# Patient Record
Sex: Male | Born: 1965 | Race: Black or African American | Hispanic: No | Marital: Single | Smoking: Current every day smoker
Health system: Southern US, Community
[De-identification: ages and names within clinical notes are randomized; demographics above are authoritative.]

## PROBLEM LIST (undated history)

## (undated) DIAGNOSIS — N200 Calculus of kidney: Secondary | ICD-10-CM

---

## 2000-08-20 ENCOUNTER — Emergency Department (HOSPITAL_COMMUNITY): Admission: EM | Admit: 2000-08-20 | Discharge: 2000-08-20 | Payer: Self-pay | Admitting: Emergency Medicine

## 2000-08-21 ENCOUNTER — Emergency Department (HOSPITAL_COMMUNITY): Admission: EM | Admit: 2000-08-21 | Discharge: 2000-08-21 | Payer: Self-pay | Admitting: Emergency Medicine

## 2012-03-28 ENCOUNTER — Emergency Department: Payer: Self-pay | Admitting: Emergency Medicine

## 2012-03-28 LAB — CBC
HCT: 45.5 % (ref 40.0–52.0)
HGB: 15.5 g/dL (ref 13.0–18.0)
MCH: 31.3 pg (ref 26.0–34.0)
MCHC: 34 g/dL (ref 32.0–36.0)
MCV: 92 fL (ref 80–100)
Platelet: 235 10*3/uL (ref 150–440)
RBC: 4.95 10*6/uL (ref 4.40–5.90)
RDW: 14.5 % (ref 11.5–14.5)
WBC: 6.7 10*3/uL (ref 3.8–10.6)

## 2012-03-28 LAB — BASIC METABOLIC PANEL
Anion Gap: 6 — ABNORMAL LOW (ref 7–16)
BUN: 10 mg/dL (ref 7–18)
Calcium, Total: 8.7 mg/dL (ref 8.5–10.1)
Chloride: 107 mmol/L (ref 98–107)
Co2: 25 mmol/L (ref 21–32)
Creatinine: 1.02 mg/dL (ref 0.60–1.30)
EGFR (African American): >60
EGFR (Non-African Amer.): >60
Glucose: 81 mg/dL (ref 65–99)
Osmolality: 274 (ref 275–301)
Potassium: 3.9 mmol/L (ref 3.5–5.1)
Sodium: 138 mmol/L (ref 136–145)

## 2014-01-25 ENCOUNTER — Inpatient Hospital Stay (HOSPITAL_COMMUNITY)
Admission: EM | Admit: 2014-01-25 | Discharge: 2014-01-26 | DRG: 379 | Discharge status: Against Medical Advice | Payer: Self-pay | Attending: Internal Medicine | Admitting: Internal Medicine

## 2014-01-25 ENCOUNTER — Encounter (HOSPITAL_COMMUNITY): Payer: Self-pay | Admitting: Emergency Medicine

## 2014-01-25 DIAGNOSIS — K2990 Gastroduodenitis, unspecified, without bleeding: Secondary | ICD-10-CM

## 2014-01-25 DIAGNOSIS — R000 Tachycardia, unspecified: Secondary | ICD-10-CM | POA: Diagnosis present

## 2014-01-25 DIAGNOSIS — R109 Unspecified abdominal pain: Secondary | ICD-10-CM | POA: Diagnosis present

## 2014-01-25 DIAGNOSIS — D500 Iron deficiency anemia secondary to blood loss (chronic): Secondary | ICD-10-CM | POA: Diagnosis present

## 2014-01-25 DIAGNOSIS — Z791 Long term (current) use of non-steroidal anti-inflammatories (NSAID): Secondary | ICD-10-CM

## 2014-01-25 DIAGNOSIS — D649 Anemia, unspecified: Secondary | ICD-10-CM

## 2014-01-25 DIAGNOSIS — R5381 Other malaise: Secondary | ICD-10-CM | POA: Diagnosis present

## 2014-01-25 DIAGNOSIS — K921 Melena: Principal | ICD-10-CM | POA: Diagnosis present

## 2014-01-25 DIAGNOSIS — K922 Gastrointestinal hemorrhage, unspecified: Secondary | ICD-10-CM | POA: Diagnosis present

## 2014-01-25 DIAGNOSIS — K2970 Gastritis, unspecified, without bleeding: Secondary | ICD-10-CM | POA: Diagnosis present

## 2014-01-25 DIAGNOSIS — F17200 Nicotine dependence, unspecified, uncomplicated: Secondary | ICD-10-CM | POA: Diagnosis present

## 2014-01-25 DIAGNOSIS — R5383 Other fatigue: Secondary | ICD-10-CM

## 2014-01-25 DIAGNOSIS — F1210 Cannabis abuse, uncomplicated: Secondary | ICD-10-CM | POA: Diagnosis present

## 2014-01-25 HISTORY — DX: Calculus of kidney: N20.0

## 2014-01-25 LAB — URINALYSIS, ROUTINE W REFLEX MICROSCOPIC
BILIRUBIN URINE: NEGATIVE
Glucose, UA: NEGATIVE mg/dL
Hgb urine dipstick: NEGATIVE
KETONES UR: NEGATIVE mg/dL
LEUKOCYTES UA: NEGATIVE
NITRITE: NEGATIVE
Protein, ur: NEGATIVE mg/dL
Specific Gravity, Urine: 1.021 (ref 1.005–1.030)
UROBILINOGEN UA: 1 mg/dL (ref 0.0–1.0)
pH: 5.5 (ref 5.0–8.0)

## 2014-01-25 LAB — TYPE AND SCREEN
ABO/RH(D): B POS
Antibody Screen: NEGATIVE

## 2014-01-25 LAB — COMPREHENSIVE METABOLIC PANEL
ALBUMIN: 2.9 g/dL — AB (ref 3.5–5.2)
ALK PHOS: 79 U/L (ref 39–117)
ALT: 6 U/L (ref 0–53)
ANION GAP: 14 (ref 5–15)
AST: 14 U/L (ref 0–37)
BUN: 17 mg/dL (ref 6–23)
CO2: 23 mEq/L (ref 19–32)
CREATININE: 0.67 mg/dL (ref 0.50–1.35)
Calcium: 8.4 mg/dL (ref 8.4–10.5)
Chloride: 97 mEq/L (ref 96–112)
GFR calc non Af Amer: >90 mL/min (ref >90)
GLUCOSE: 112 mg/dL — AB (ref 70–99)
POTASSIUM: 3.4 meq/L — AB (ref 3.7–5.3)
Sodium: 134 mEq/L — ABNORMAL LOW (ref 137–147)
Total Bilirubin: 0.2 mg/dL — ABNORMAL LOW (ref 0.3–1.2)
Total Protein: 6.6 g/dL (ref 6.0–8.3)

## 2014-01-25 LAB — CBC WITH DIFFERENTIAL/PLATELET
BASOS PCT: 0 % (ref 0–1)
Basophils Absolute: 0 10*3/uL (ref 0.0–0.1)
Eosinophils Absolute: 0.1 10*3/uL (ref 0.0–0.7)
Eosinophils Relative: 0 % (ref 0–5)
HEMATOCRIT: 16.6 % — AB (ref 39.0–52.0)
HEMOGLOBIN: 5.4 g/dL — AB (ref 13.0–17.0)
LYMPHS ABS: 3.6 10*3/uL (ref 0.7–4.0)
Lymphocytes Relative: 22 % (ref 12–46)
MCH: 31.8 pg (ref 26.0–34.0)
MCHC: 32.5 g/dL (ref 30.0–36.0)
MCV: 97.6 fL (ref 78.0–100.0)
MONO ABS: 1 10*3/uL (ref 0.1–1.0)
MONOS PCT: 6 % (ref 3–12)
NEUTROS PCT: 71 % (ref 43–77)
Neutro Abs: 11.6 10*3/uL — ABNORMAL HIGH (ref 1.7–7.7)
Platelets: 377 10*3/uL (ref 150–400)
RBC: 1.7 MIL/uL — AB (ref 4.22–5.81)
RDW: 19.1 % — ABNORMAL HIGH (ref 11.5–15.5)
WBC: 16.3 10*3/uL — AB (ref 4.0–10.5)

## 2014-01-25 LAB — ABO/RH: ABO/RH(D): B POS

## 2014-01-25 LAB — I-STAT CG4 LACTIC ACID, ED: Lactic Acid, Venous: 2.97 mmol/L — ABNORMAL HIGH (ref 0.5–2.2)

## 2014-01-25 MED ORDER — SODIUM CHLORIDE 0.9 % IV SOLN: Freq: Once | INTRAVENOUS | Status: DC

## 2014-01-25 MED ORDER — SODIUM CHLORIDE 0.9 % IV SOLN: INTRAVENOUS | Status: DC

## 2014-01-25 MED ORDER — SODIUM CHLORIDE 0.9 % IV SOLN
80.0000 mg | Freq: Two times a day (BID) | INTRAVENOUS | Status: DC
  Administered 2014-01-26: 80 mg via INTRAVENOUS
  Filled 2014-01-25 (×2): qty 80

## 2014-01-25 MED ORDER — SODIUM CHLORIDE 0.9 % IJ SOLN
3.0000 mL | Freq: Two times a day (BID) | INTRAMUSCULAR | Status: DC
  Administered 2014-01-26: 3 mL via INTRAVENOUS

## 2014-01-25 MED ORDER — SODIUM CHLORIDE 0.9 % IV BOLUS (SEPSIS)
1000.0000 mL | Freq: Once | INTRAVENOUS | Status: AC
  Administered 2014-01-25: 1000 mL via INTRAVENOUS

## 2014-01-25 NOTE — ED Notes (Signed)
Pt also reports that he had ear infection last week as well. He was self treating himself.
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
## 2014-01-25 NOTE — ED Notes (Addendum)
Pt ripped up his signed consent form. Telepsych at bedside.

## 2014-01-25 NOTE — ED Notes (Signed)
Dr Katrinka BlazingSmith notified of hgb of 5.4 and hct of 16.6
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
## 2014-01-25 NOTE — ED Notes (Signed)
telepsych in process. 
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
## 2014-01-25 NOTE — H&P (Addendum)
[facility] History and Physical  GUENTHER DUNSHEE WJX:[id number] DOB: May 02, 1966 DOA: 01/25/2014  Referring physician: EDP PCP: Burtis Junes, MD   Chief Complaint: Melena and anemia   HPI: Jeremy [name] is a 48 y.o. male who presents to the ED with complaints of fatigue.  Work up in the ED revealed HGB of 5.4.  He insists that his fatigue started after an ear infection progressed to an oral cold sore and that the cold sore is the site of his anemia.  He had dark green stools as a result of his cold sore drainage which progressed to melena after mild electrocution at work.  Of note he does admit to using 2 tabs of aleve a day for the past 2-3 weeks.  Review of Systems: Systems reviewed.  As above, otherwise negative  Past Medical History  Diagnosis Date  . Kidney stones    History reviewed. No pertinent past surgical history. Social History:  reports that he has been smoking Cigars.  He does not have any smokeless tobacco history on file. He reports that he uses illicit drugs (Marijuana). He reports that he does not drink alcohol.  No Known Allergies  No family history on file.   Prior to Admission medications   Medication Sig Start Date End Date Taking? Authorizing Provider  bismuth subsalicylate (PEPTO-BISMOL) 262 MG chewable tablet Chew 524 mg by mouth daily as needed for indigestion or diarrhea or loose stools.   Yes Historical Provider, MD  naproxen sodium (ANAPROX) 220 MG tablet Take 220 mg by mouth daily as needed. For pain   Yes Historical Provider, MD   Physical Exam: Filed Vitals:   01/25/14 1921  BP: 117/67  Pulse: 116  Temp:   Resp: 16    BP 117/67  Pulse 116  Temp(Src) 98.1 F (36.7 C) (Oral)  Resp 16  Ht 5\' 10"  (1.778 m)  Wt 72.576 kg (160 lb)  BMI 22.96 kg/m2  SpO2 100%  General Appearance:    Alert, oriented, no distress, appears stated age  Head:    Normocephalic, atraumatic  Eyes:    PERRL, EOMI, sclera non-icteric        Nose:    Nares without drainage or epistaxis. Mucosa, turbinates normal  Throat:   Moist mucous membranes. Oropharynx without erythema or exudate.  Neck:   Supple. No carotid bruits.  No thyromegaly.  No lymphadenopathy.   Back:     No CVA tenderness, no spinal tenderness  Lungs:     Clear to auscultation bilaterally, without wheezes, rhonchi or rales  Chest wall:    No tenderness to palpitation  Heart:    Regular rate and rhythm without murmurs, gallops, rubs  Abdomen:     Soft, non-tender, nondistended, normal bowel sounds, no organomegaly  Genitalia:    deferred  Rectal:    deferred  Extremities:   No clubbing, cyanosis or edema.  Pulses:   2+ and symmetric all extremities  Skin:   Skin color, texture, turgor normal, no rashes or lesions  Lymph nodes:   Cervical, supraclavicular, and axillary nodes normal  Neurologic:   CNII-XII intact. Normal strength, sensation and reflexes      throughout    Labs on Admission:  Basic Metabolic Panel:  Recent Labs Lab 01/25/14 1712  NA 134*  K 3.4*  CL 97  CO2 23  GLUCOSE 112*  BUN 17  CREATININE 0.67  CALCIUM 8.4   Liver Function Tests:  Recent Labs Lab 01/25/14 1712  AST 14  ALT 6  ALKPHOS 79  BILITOT 0.2*  PROT 6.6  ALBUMIN 2.9*   No results found for this basename: LIPASE, AMYLASE,  in the last 168 hours No results found for this basename: AMMONIA,  in the last 168 hours CBC:  Recent Labs Lab 01/25/14 1712  WBC 16.3*  NEUTROABS 11.6*  HGB 5.4*  HCT 16.6*  MCV 97.6  PLT 377   Cardiac Enzymes: No results found for this basename: CKTOTAL, CKMB, CKMBINDEX, TROPONINI,  in the last 168 hours  BNP (last 3 results) No results found for this basename: PROBNP,  in the last 8760 hours CBG: No results found for this basename: GLUCAP,  in the last 168 hours  Radiological Exams on Admission: No results found.  EKG: Independently reviewed.  Assessment/Plan Active Problems:   GI bleed   1. GIB - strongly suspect UGIB  secondary to ulcer, probably an NSAID induced ulcer given the aleve use history.  GI agrees that this is the likely source of his bleeding and wishes to do EGD tomorrow and blood transfusion tonight; however, patient persists in the belief that GI blood loss is due to his cold sore.  Now he is making statements also that he is "ready to go if God wants him to go", refusing treatment and persisting in the belief that the blood loss is due to the "cold sore" he had 2 weeks ago in his mouth.  Trying to get a hold of mother to get more info on his baseline, also may end up with TTS consult to determine decision making capacity.  For now have put in admit orders including NPO, PPI, NS.    Code Status: Full Code  Family Communication: No family in room, we are attempting to get in contact with his mother Disposition Plan: Admission to inpatient if he agrees or is deemed to lack decision making capacity, AMA discharge if he does have decision making capacity and continues to refuse admission.  TTS consult planned in ED to help determine this as per our protocol.  Time spent: 70 min  Jacari Iannello M. [facility] Pager [phone]  If 7AM-7PM, please contact the day team taking care of the patient [email or web address] Password [email or web address] 01/25/2014, 7:34 PM

## 2014-01-25 NOTE — ED Notes (Signed)
Pt is refusing transfusion at this time. Spoke with Dr. Rosalia Hammersay. Dr. Lenna Gilfordrdered Tele-Psych. Awaiting consult.
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
## 2014-01-26 ENCOUNTER — Inpatient Hospital Stay (HOSPITAL_COMMUNITY)
Admission: EM | Admit: 2014-01-26 | Discharge: 2014-01-30 | DRG: 812 | Disposition: A | Discharge status: Home / Self Care | Payer: Worker's Compensation | Attending: Internal Medicine | Admitting: Internal Medicine

## 2014-01-26 ENCOUNTER — Encounter (HOSPITAL_COMMUNITY)
Admission: EM | Discharge status: Against Medical Advice | Payer: Self-pay | Source: Home / Self Care | Attending: Internal Medicine

## 2014-01-26 ENCOUNTER — Encounter (HOSPITAL_COMMUNITY): Payer: Self-pay | Admitting: Emergency Medicine

## 2014-01-26 DIAGNOSIS — Z515 Encounter for palliative care: Secondary | ICD-10-CM

## 2014-01-26 DIAGNOSIS — F22 Delusional disorders: Secondary | ICD-10-CM | POA: Diagnosis present

## 2014-01-26 DIAGNOSIS — D500 Iron deficiency anemia secondary to blood loss (chronic): Secondary | ICD-10-CM | POA: Diagnosis present

## 2014-01-26 DIAGNOSIS — D62 Acute posthemorrhagic anemia: Principal | ICD-10-CM | POA: Diagnosis present

## 2014-01-26 DIAGNOSIS — I959 Hypotension, unspecified: Secondary | ICD-10-CM | POA: Diagnosis present

## 2014-01-26 DIAGNOSIS — Z9119 Patient's noncompliance with other medical treatment and regimen: Secondary | ICD-10-CM

## 2014-01-26 DIAGNOSIS — D649 Anemia, unspecified: Secondary | ICD-10-CM

## 2014-01-26 DIAGNOSIS — Z91199 Patient's noncompliance with other medical treatment and regimen due to unspecified reason: Secondary | ICD-10-CM

## 2014-01-26 DIAGNOSIS — E538 Deficiency of other specified B group vitamins: Secondary | ICD-10-CM | POA: Diagnosis present

## 2014-01-26 DIAGNOSIS — Z598 Other problems related to housing and economic circumstances: Secondary | ICD-10-CM

## 2014-01-26 DIAGNOSIS — Z5987 Material hardship due to limited financial resources, not elsewhere classified: Secondary | ICD-10-CM

## 2014-01-26 DIAGNOSIS — F17200 Nicotine dependence, unspecified, uncomplicated: Secondary | ICD-10-CM | POA: Diagnosis present

## 2014-01-26 DIAGNOSIS — R000 Tachycardia, unspecified: Secondary | ICD-10-CM | POA: Diagnosis present

## 2014-01-26 DIAGNOSIS — E861 Hypovolemia: Secondary | ICD-10-CM | POA: Diagnosis present

## 2014-01-26 DIAGNOSIS — F329 Major depressive disorder, single episode, unspecified: Secondary | ICD-10-CM | POA: Diagnosis present

## 2014-01-26 DIAGNOSIS — F3289 Other specified depressive episodes: Secondary | ICD-10-CM

## 2014-01-26 DIAGNOSIS — Z87442 Personal history of urinary calculi: Secondary | ICD-10-CM

## 2014-01-26 DIAGNOSIS — K922 Gastrointestinal hemorrhage, unspecified: Secondary | ICD-10-CM | POA: Diagnosis present

## 2014-01-26 DIAGNOSIS — K047 Periapical abscess without sinus: Secondary | ICD-10-CM | POA: Diagnosis present

## 2014-01-26 DIAGNOSIS — E876 Hypokalemia: Secondary | ICD-10-CM | POA: Diagnosis present

## 2014-01-26 DIAGNOSIS — F1210 Cannabis abuse, uncomplicated: Secondary | ICD-10-CM | POA: Diagnosis present

## 2014-01-26 LAB — CBC WITH DIFFERENTIAL/PLATELET
BASOS PCT: 0 % (ref 0–1)
Basophils Absolute: 0 10*3/uL (ref 0.0–0.1)
EOS ABS: 0.1 10*3/uL (ref 0.0–0.7)
EOS PCT: 0 % (ref 0–5)
HEMATOCRIT: 12.7 % — AB (ref 39.0–52.0)
HEMOGLOBIN: 4.1 g/dL — AB (ref 13.0–17.0)
Lymphocytes Relative: 20 % (ref 12–46)
Lymphs Abs: 2.5 10*3/uL (ref 0.7–4.0)
MCH: 31.3 pg (ref 26.0–34.0)
MCHC: 32.3 g/dL (ref 30.0–36.0)
MCV: 96.9 fL (ref 78.0–100.0)
MONO ABS: 0.9 10*3/uL (ref 0.1–1.0)
MONOS PCT: 7 % (ref 3–12)
NEUTROS ABS: 9.3 10*3/uL — AB (ref 1.7–7.7)
Neutrophils Relative %: 73 % (ref 43–77)
Platelets: 330 10*3/uL (ref 150–400)
RBC: 1.31 MIL/uL — ABNORMAL LOW (ref 4.22–5.81)
RDW: 21 % — ABNORMAL HIGH (ref 11.5–15.5)
WBC: 12.8 10*3/uL — ABNORMAL HIGH (ref 4.0–10.5)

## 2014-01-26 LAB — I-STAT CHEM 8, ED
BUN: 11 mg/dL (ref 6–23)
CALCIUM ION: 1.15 mmol/L (ref 1.12–1.23)
CHLORIDE: 102 meq/L (ref 96–112)
Creatinine, Ser: 0.8 mg/dL (ref 0.50–1.35)
GLUCOSE: 109 mg/dL — AB (ref 70–99)
HEMATOCRIT: 13 % — AB (ref 39.0–52.0)
Hemoglobin: 4.4 g/dL — CL (ref 13.0–17.0)
Potassium: 3.3 mEq/L — ABNORMAL LOW (ref 3.7–5.3)
Sodium: 137 mEq/L (ref 137–147)
TCO2: 22 mmol/L (ref 0–100)

## 2014-01-26 LAB — HEMOGLOBIN AND HEMATOCRIT, BLOOD
HCT: 12 % — ABNORMAL LOW (ref 39.0–52.0)
HEMATOCRIT: 11.8 % — AB (ref 39.0–52.0)
Hemoglobin: 3.9 g/dL — CL (ref 13.0–17.0)
Hemoglobin: 3.8 g/dL — CL (ref 13.0–17.0)

## 2014-01-26 LAB — FERRITIN: Ferritin: 164 ng/mL (ref 22–322)

## 2014-01-26 LAB — RETICULOCYTES
RBC.: 1.33 MIL/uL — ABNORMAL LOW (ref 4.22–5.81)
RETIC CT PCT: 20.6 % — AB (ref 0.4–3.1)
Retic Count, Absolute: 274 10*3/uL — ABNORMAL HIGH (ref 19.0–186.0)

## 2014-01-26 LAB — IRON AND TIBC
IRON: 11 ug/dL — AB (ref 42–135)
Saturation Ratios: 5 % — ABNORMAL LOW (ref 20–55)
TIBC: 239 ug/dL (ref 215–435)
UIBC: 228 ug/dL (ref 125–400)

## 2014-01-26 LAB — VITAMIN B12: Vitamin B-12: 276 pg/mL (ref 211–911)

## 2014-01-26 LAB — FOLATE: Folate: 6.7 ng/mL

## 2014-01-26 LAB — MRSA PCR SCREENING: MRSA by PCR: NEGATIVE

## 2014-01-26 SURGERY — EGD (ESOPHAGOGASTRODUODENOSCOPY)
Anesthesia: Moderate Sedation

## 2014-01-26 MED ORDER — SODIUM CHLORIDE 0.9 % IV SOLN
25.0000 mg | Freq: Once | INTRAVENOUS | Status: AC
  Administered 2014-01-26: 25 mg via INTRAVENOUS
  Filled 2014-01-26 (×2): qty 0.5

## 2014-01-26 MED ORDER — MORPHINE SULFATE 2 MG/ML IJ SOLN
0.5000 mg | INTRAMUSCULAR | Status: DC | PRN
  Filled 2014-01-26: qty 1

## 2014-01-26 MED ORDER — ONDANSETRON HCL 4 MG PO TABS: 4.0000 mg | ORAL_TABLET | Freq: Four times a day (QID) | ORAL | Status: DC | PRN

## 2014-01-26 MED ORDER — ONDANSETRON HCL 4 MG/2ML IJ SOLN: 4.0000 mg | Freq: Four times a day (QID) | INTRAMUSCULAR | Status: DC | PRN

## 2014-01-26 MED ORDER — ACETAMINOPHEN 325 MG PO TABS
650.0000 mg | ORAL_TABLET | Freq: Four times a day (QID) | ORAL | Status: DC | PRN
  Administered 2014-01-26: 325 mg via ORAL
  Administered 2014-01-26 – 2014-01-27 (×2): 650 mg via ORAL
  Filled 2014-01-26 (×3): qty 2

## 2014-01-26 MED ORDER — CHLORHEXIDINE GLUCONATE 0.12 % MT SOLN
15.0000 mL | Freq: Two times a day (BID) | OROMUCOSAL | Status: DC
  Administered 2014-01-27: 15 mL via OROMUCOSAL
  Filled 2014-01-26 (×6): qty 15

## 2014-01-26 MED ORDER — POTASSIUM CHLORIDE 10 MEQ/100ML IV SOLN
10.0000 meq | INTRAVENOUS | Status: AC
  Administered 2014-01-26: 10 meq via INTRAVENOUS
  Filled 2014-01-26 (×2): qty 100

## 2014-01-26 MED ORDER — IRON DEXTRAN 50 MG/ML IJ SOLN
50.0000 mg | Freq: Once | INTRAMUSCULAR | Status: AC
  Administered 2014-01-26: 50 mg via INTRAVENOUS
  Filled 2014-01-26: qty 1

## 2014-01-26 MED ORDER — SODIUM CHLORIDE 0.9 % IV SOLN
INTRAVENOUS | Status: DC
  Administered 2014-01-26 – 2014-01-29 (×6): via INTRAVENOUS
  Administered 2014-01-29: 125 mL/h via INTRAVENOUS
  Administered 2014-01-30: 01:00:00 via INTRAVENOUS

## 2014-01-26 MED ORDER — PANTOPRAZOLE SODIUM 40 MG IV SOLR
40.0000 mg | Freq: Two times a day (BID) | INTRAVENOUS | Status: DC
  Filled 2014-01-26 (×2): qty 40

## 2014-01-26 MED ORDER — SODIUM CHLORIDE 0.9 % IV SOLN: INTRAVENOUS | Status: DC

## 2014-01-26 MED ORDER — HYDROCODONE-ACETAMINOPHEN 5-325 MG PO TABS: 1.0000 | ORAL_TABLET | Freq: Four times a day (QID) | ORAL | Status: DC | PRN

## 2014-01-26 MED ORDER — CETYLPYRIDINIUM CHLORIDE 0.05 % MT LIQD
7.0000 mL | Freq: Two times a day (BID) | OROMUCOSAL | Status: DC
  Administered 2014-01-26: 7 mL via OROMUCOSAL
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
## 2014-01-26 NOTE — ED Notes (Signed)
Dr. Bednar at bedside. 

## 2014-01-26 NOTE — ED Notes (Signed)
Pt. Stated, "I want to fire the doctor."  Explained to the pt. That he cannot fire the doctor.  Explained to the pt. That he has a group of doctors taking care of him and he will see many doctors.

## 2014-01-26 NOTE — ED Notes (Signed)
Chaplain at the bedside talking with the pt.

## 2014-01-26 NOTE — ED Notes (Signed)
Pt reports black stools since July 4th; had a dental abcess x 2 weeks ago that patient sts "I popped the abscess with hot water and saline; the blood and infection from the abscess has drained in my stomach which has caused me to not want to eat. I've been drinking clear liquids x 2 weeks." Pt was admitted for GI blood; refused to have an endoscopy or blood transfusion.  Reports "I know there is a way I can naturally raise my blood levels without a blood transfusion."

## 2014-01-26 NOTE — ED Notes (Signed)
Reported to Dr. Blake DivineAkula, pt.s Hemglobin has decreased 3.9.  Dr. Blake DivineAkula is going to talk with the Ethics Committee.  Psychiatrist has talked with pt.  Pt. Continues to refuse to get blood.  Reported to pt. That his blood level has decreased again.  Pt. Stated, "Lets wait to see if the Iron works."   Explained to pt. That he needs a blood transfusion and that could make him feel better.  Explained to pt. That not receiving blood could be life threatening.  Pt. Verbalized "I understand, lets wait and hour or so to see if the Iron works."
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
## 2014-01-26 NOTE — ED Notes (Signed)
  Spoke with Admitting MD, They will be down to talk with pt.per pts. request

## 2014-01-26 NOTE — Progress Notes (Signed)
Chaplain received page to speak with pt who is refusing treatment to determine if religious convictions may be involved.  Pt not refusing particular treatments because of religious convictions but is very confident that he believes he knows what's going on with him. Pt cited that his body is different from other bodies and doesn't need the treatments HCPs are attempting to provide. Pt also noted that he hasn't had solid stool in weeks and believes that "a good dump" will cleanse " the blood and pus or whatever" from his stomach. Pt also believes that "stomach doctors are being paid to probe him, no probe no pay". Pt lastly cited that he believes in miracles and that if need be, he'll take the miracle. He refuses an "overnight fix" and would rather "take the long road".  Gala RomneyLarry J Brown

## 2014-01-26 NOTE — Progress Notes (Signed)
Patient due for endoscopy 01/26/14, consent not obtained since patient states he was not given any information regarding the procedure. Questions asked by patient do support he needs information regarding the procedure. Will pass information to morning RN.

## 2014-01-26 NOTE — ED Notes (Signed)
Spoke with pt. And informed him that he could die without receiving any blood.  Pt. Verbalized understanding  And stated. "I can also die from not eating."

## 2014-01-26 NOTE — H&P (Signed)
[facility] History and Physical  Jeremy [name]:[id number] DOB: April 19, 1966 DOA: 01/26/2014  Referring physician: PCP: Burtis Junes, MD   Chief Complaint: generalized weakness  HPI: Jeremy [name] is a 48 y.o. male with prior h/o kidney stones comes in for generalized weakness for the last few weeks. As per the patient, he had a tooth abscess started around July 5th, and he popped the abscess and since then he reports he had some bleeding from the gums. He also reports taking aleve since then for pain. He reports he has pain all over his body. He reports he has malena for a few weeks now. He reports upset stomach, nausea, no vomiting , some abdominal discomfort, . No hematemesis. No diarrhea or constipation. He denies any weight loss or fever or chills. He reports fatigue and generalized weakness on exertion.  On arrival to ED last night, he was found ato have a hemoglobin around 5. He was admitted by Dr Julian Reil and was put on clear liquid diet, ordered blood transfusions and GI consulted. Dr Elnoria Howard saw the patient , recommended EGD in am. But patient was adamant in refusing blood transfusions, EGD. He left AMA. He reported that he went to cafeteria, got snacks and came back to ED. He was referred back to [place] for admission. His repeat hemoglobin this am dropped to 4.4. He continued to refuse the blood transfusions and EGD, even after explaining that refusing them could be life threatening and fatal. He refused to be on clear liquid diet. I have explained to him in detail the risks of refusing the above , will lead to shock and possibly death. He explained that he believes in church and god, Clint Bolder 't believe he is bleeding from his gut. He was swearing and then picked up a bag of chips and ate them. Requested a psychiatry consult to evaluate for capacity evaluation. Patient further requesting IV iron. He will be admitted to step down for further evaluation and recommendation.    Review of  Systems:  Constitutional:  No weight loss, night sweats, Fevers, chills, HEENT:  No headaches, Difficulty swallowing,Tooth/dental problems,Sore throat,  No sneezing, itching, ear ache, nasal congestion, post nasal drip,  Cardio-vascular:  No chest pain, Orthopnea, PND, swelling in lower extremities, anasarca, dizziness, palpitations  GI:  See HPI Resp:  Positive for sob on exertion. . No excess mucus, no productive cough, No non-productive cough, No coughing up of blood.No change in color of mucus.No wheezing.No chest wall deformity  Skin:  no rash or lesions.  GU:  no dysuria, change in color of urine, no urgency or frequency. No flank pain.  Musculoskeletal:  No joint pain or swelling. No decreased range of motion. No back pain.  Psych:  ANXIOUS, swearing , . He came in last night was admitted by Dr Julian Reil and Dr Elnoria Howard, left AMA wanting to eat solid food.   Past Medical History  Diagnosis Date  . Kidney stones    History reviewed. No pertinent past surgical history. Social History:  reports that he has been smoking Cigars.  He does not have any smokeless tobacco history on file. He reports that he uses illicit drugs (Marijuana). He reports that he does not drink alcohol.  No Known Allergies  History reviewed. No pertinent family history.   Prior to Admission medications   Not on File   Physical Exam: Filed Vitals:   01/26/14 0645 01/26/14 0700 01/26/14 0715 01/26/14 0730  BP: 105/59 99/58 91/55  102/81  Pulse: 105  117 102 108  Temp:      TempSrc:      Resp: 15 16 18 16   SpO2: 100% 100% 100% 100%    Wt Readings from Last 3 Encounters:  01/25/14 72.576 kg (160 lb)  01/25/14 72.576 kg (160 lb)    General:  Agitated and restless.  Eyes: PERRL, normal lids, irises &pale conjuctiva Neck: no LAD, masses or thyromegaly Cardiovascular: tachycardic, no m/r/g.  Respiratory: CTA bilaterally, no w/r/r. Normal respiratory effort. Abdomen: soft, ntnd  Skin: hypopigmented  macular patches over his upper and lower extremity.  Musculoskeletal: grossly normal tone BUE/BLE Psychiatric: agitated, refusing blood transfusion and EGD, reports he is not a [place] witness. Sweating that we are not giving him any food.  Neurologic: speech normal, no facial droop, able to move all extremities. No focal deficits. He is oriented to place and person and time.           Labs on Admission:  Basic Metabolic Panel:  Recent Labs Lab 01/25/14 1712 01/26/14 0801  NA 134* 137  K 3.4* 3.3*  CL 97 102  CO2 23  --   GLUCOSE 112* 109*  BUN 17 11  CREATININE 0.67 0.80  CALCIUM 8.4  --    Liver Function Tests:  Recent Labs Lab 01/25/14 1712  AST 14  ALT 6  ALKPHOS 79  BILITOT 0.2*  PROT 6.6  ALBUMIN 2.9*   No results found for this basename: LIPASE, AMYLASE,  in the last 168 hours No results found for this basename: AMMONIA,  in the last 168 hours CBC:  Recent Labs Lab 01/25/14 1712 01/26/14 0728 01/26/14 0801  WBC 16.3* 12.8*  --   NEUTROABS 11.6* 9.3*  --   HGB 5.4* 4.1* 4.4*  HCT 16.6* 12.7* 13.0*  MCV 97.6 96.9  --   PLT 377 330  --    Cardiac Enzymes: No results found for this basename: CKTOTAL, CKMB, CKMBINDEX, TROPONINI,  in the last 168 hours  BNP (last 3 results) No results found for this basename: PROBNP,  in the last 8760 hours CBG: No results found for this basename: GLUCAP,  in the last 168 hours  Radiological Exams on Admission: No results found.  EKG: sinus tachycardia  Assessment/Plan Active Problems:   GI bleed   Anemia of  Acute blood loss possibly from GI bleed which probably is from the aleve he has been taking for more thatn 3 weeks since July 5th: - admit to step down. He is currently refusing blood transfusion and EGD. Spoke to Dr Elnoria Howard, who has spoken to the patient last night multiple times about the importance of getting the EGD. Patient has agreed for IV iron. We have ordered iron and ferritin levels. IV protonix  q12hrs. Clear liquid diet and H&h every 12 hours.  - hold all NSAIDS.  - borderline bp measurements.  - IV hydration.  - scd's/   Code Status: full code DVT Prophylaxis: scd's Family Communication: none at bedside, and he does not want me to talk to any family members.  Disposition Plan: admit to step down.   Time spent: 120  Minutes.   [facility] Pager [phone]  **Disclaimer: This note may have been dictated with voice recognition software. Similar sounding words can inadvertently be transcribed and this note may contain transcription errors which may not have been corrected upon publication of note.**  Addendum: Called and spoke to Dr Elnoria Howard over the phone, and since patient is refusing EGD and blood transfusions,  there is nothing much we can do at this point. Psychiatrist Dr Shela CommonsJ suggested patient has capacity to make medical decisions, and he understand the implications and risks he is taking , including the possibility of death, by refusing the blood transfusions. Requested to speak with his family members in case of emergency and he reported he doesn't know his mothers' phone number. He will be admitted to stepdown and monitored closely.    Kathlen ModyVijaya Javon Hupfer, MD [phone]

## 2014-01-26 NOTE — ED Notes (Signed)
Family updated on pt. Care and plan of care. Family at the Bedside.  Dr. Craige CottaSood and Dr. Blake DivineAkula into assess pt.
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
## 2014-01-26 NOTE — Progress Notes (Signed)
Patient has left AMA.  He is now in the ED lobby eating food.  Per RN he states he will "call 911 to go to a different hospital" after he is done eating food.

## 2014-01-26 NOTE — Progress Notes (Signed)
RN questioned pt coming to stepdown from ED, with HG of 3.9 and BP low and was refusing blood transfusion , RN Lawson FiscalLori said that per Dr. Craige CottaSood (PCCM) pt is stable to go to [facility] and we can call him if BP will drop.
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
## 2014-01-26 NOTE — ED Notes (Signed)
Pt. Is upset does not want a clear liquid diet.  Pt. Want "real food".   Explained to the pt. That the doctor needs to place the order.,  Pt. Very upset.  Paged the Admitting MD again.

## 2014-01-26 NOTE — Progress Notes (Signed)
Patient insists he wants something to eat. RN told by patient not to come in to his room without food and water. Patient still agitated finally left AMA. MD Rolland BimlerGardiner present on floor.

## 2014-01-26 NOTE — Progress Notes (Signed)
Charge nurse escorted patient in a wheel chair to the ED lobby.
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
## 2014-01-26 NOTE — ED Notes (Signed)
Chem 8 results given to Dr. Elesa MassedWard

## 2014-01-26 NOTE — ED Provider Notes (Signed)
CSN: [id number]     Arrival date & time 01/26/14  0545 History   First MD Initiated Contact with Patient 01/26/14 [phone]     Chief Complaint  Patient presents with  . Fatigue  fatigue   (Consider location/radiation/quality/duration/timing/severity/associated sxs/prior Treatment) HPI Just admitted in the hospital within the last 24 hours for fatigue with black stool for a month with hemoglobin just over 5 refused blood transfusion and refused endoscopy which was scheduled for today and left the hospital AGAINST MEDICAL ADVICE but came back to the emergency department immediately from the inpatient unit now states he is willing to be readmitted for an acid blocker and iron infusion if he qualifies for one.  Abd: SNT  0645 d/w [facility] for re-admit  Past Medical History  Diagnosis Date  . Kidney stones    History reviewed. No pertinent past surgical history. History reviewed. No pertinent family history. History  Substance Use Topics  . Smoking status: Current Every Day Smoker    Types: Cigars  . Smokeless tobacco: Not on file  . Alcohol Use: No    Review of Systems  10 Systems reviewed and are negative for acute change except as noted in the HPI.  Allergies  Review of patient's allergies indicates no known allergies.  Home Medications   Prior to Admission medications   Not on File   BP 94/49  Pulse 104  Temp(Src) 98.4 F (36.9 C) (Oral)  Resp 18  Ht 5\' 10"  (1.778 m)  Wt 154 lb 15.7 oz (70.3 kg)  BMI 22.24 kg/m2  SpO2 97% Physical Exam  Nursing note and vitals reviewed. Constitutional:  Awake, alert, nontoxic appearance.  HENT:  Head: Atraumatic.  Eyes: Right eye exhibits no discharge. Left eye exhibits no discharge.  Neck: Neck supple.  Cardiovascular: Normal rate and regular rhythm.   No murmur heard. Pulmonary/Chest: Effort normal and breath sounds normal. No respiratory distress. He has no wheezes. He has no rales. He exhibits no tenderness.  Abdominal: Soft.  Bowel sounds are normal. He exhibits no distension. There is no tenderness. There is no rebound and no guarding.  Musculoskeletal: He exhibits no tenderness.  Baseline ROM, no obvious new focal weakness.  Neurological:  Mental status and motor strength appears baseline for patient and situation.  Skin: No rash noted.  Psychiatric: He has a normal mood and affect.    ED Course  Procedures (including critical care time) Labs Review Labs Reviewed  CBC WITH DIFFERENTIAL - Abnormal; Notable for the following:    WBC 12.8 (*)    RBC 1.31 (*)    Hemoglobin 4.1 (*)    HCT 12.7 (*)    RDW 21.0 (*)    Neutro Abs 9.3 (*)    All other components within normal limits  IRON AND TIBC - Abnormal; Notable for the following:    Iron 11 (*)    Saturation Ratios 5 (*)    All other components within normal limits  RETICULOCYTES - Abnormal; Notable for the following:    Retic Ct Pct 20.6 (*)    RBC. 1.33 (*)    Retic Count, Manual 274.0 (*)    All other components within normal limits  HEMOGLOBIN AND HEMATOCRIT, BLOOD - Abnormal; Notable for the following:    Hemoglobin 3.9 (*)    HCT 12.0 (*)    All other components within normal limits  BASIC METABOLIC PANEL - Abnormal; Notable for the following:    Calcium 7.6 (*)    All other components  within normal limits  CBC - Abnormal; Notable for the following:    RBC 1.16 (*)    Hemoglobin 3.7 (*)    HCT 11.5 (*)    RDW 22.0 (*)    All other components within normal limits  HEMOGLOBIN AND HEMATOCRIT, BLOOD - Abnormal; Notable for the following:    Hemoglobin 3.8 (*)    HCT 11.8 (*)    All other components within normal limits  I-STAT CHEM 8, ED - Abnormal; Notable for the following:    Potassium 3.3 (*)    Glucose, Bld 109 (*)    Hemoglobin 4.4 (*)    HCT 13.0 (*)    All other components within normal limits  MRSA PCR SCREENING  VITAMIN B12  FOLATE  FERRITIN  CBC    Imaging Review No results found.   EKG Interpretation None       MDM   Final diagnoses:  Gastrointestinal hemorrhage, unspecified gastritis, unspecified gastrointestinal hemorrhage type  Severe anemia    Still refuses transfusion and EGD, appears to have capacity to make medical decision to refuse transfusion. Patient informed of clinical course, understand medical decision-making process, and agree with plan. The patient appears reasonably stabilized for admission considering the current resources, flow, and capabilities available in the ED at this time, and I doubt any other [facility] requiring further screening and/or treatment in the ED prior to admission.    Jeremy HornJohn M Lilyian Quayle, MD 01/27/14 [phone]
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
## 2014-01-27 LAB — CBC
HCT: 11.5 % — ABNORMAL LOW (ref 39.0–52.0)
HEMOGLOBIN: 3.7 g/dL — AB (ref 13.0–17.0)
MCH: 31.9 pg (ref 26.0–34.0)
MCHC: 32.2 g/dL (ref 30.0–36.0)
MCV: 99.1 fL (ref 78.0–100.0)
PLATELETS: 314 10*3/uL (ref 150–400)
RBC: 1.16 MIL/uL — AB (ref 4.22–5.81)
RDW: 22 % — ABNORMAL HIGH (ref 11.5–15.5)
WBC: 10.1 10*3/uL (ref 4.0–10.5)

## 2014-01-27 LAB — BASIC METABOLIC PANEL
Anion gap: 9 (ref 5–15)
BUN: 10 mg/dL (ref 6–23)
CHLORIDE: 108 meq/L (ref 96–112)
CO2: 23 meq/L (ref 19–32)
Calcium: 7.6 mg/dL — ABNORMAL LOW (ref 8.4–10.5)
Creatinine, Ser: 0.77 mg/dL (ref 0.50–1.35)
GFR calc Af Amer: >90 mL/min (ref >90)
GLUCOSE: 91 mg/dL (ref 70–99)
POTASSIUM: 4 meq/L (ref 3.7–5.3)
SODIUM: 140 meq/L (ref 137–147)

## 2014-01-27 MED ORDER — CYANOCOBALAMIN 1000 MCG/ML IJ SOLN
1000.0000 ug | Freq: Every day | INTRAMUSCULAR | Status: DC
  Administered 2014-01-27 – 2014-01-29 (×3): 1000 ug via SUBCUTANEOUS
  Filled 2014-01-27 (×5): qty 1

## 2014-01-27 MED ORDER — DARBEPOETIN ALFA-POLYSORBATE 100 MCG/0.5ML IJ SOLN
100.0000 ug | INTRAMUSCULAR | Status: DC
  Administered 2014-01-27: 100 ug via SUBCUTANEOUS
  Filled 2014-01-27: qty 0.5

## 2014-01-27 MED ORDER — OXYCODONE-ACETAMINOPHEN 5-325 MG PO TABS
1.0000 | ORAL_TABLET | Freq: Four times a day (QID) | ORAL | Status: DC | PRN
  Administered 2014-01-27 – 2014-01-28 (×4): 2 via ORAL
  Filled 2014-01-27 (×4): qty 2

## 2014-01-27 MED ORDER — SENNOSIDES-DOCUSATE SODIUM 8.6-50 MG PO TABS: 2.0000 | ORAL_TABLET | Freq: Every evening | ORAL | Status: DC | PRN

## 2014-01-27 MED ORDER — SODIUM CHLORIDE 0.9 % IV SOLN
1000.0000 mg | Freq: Once | INTRAVENOUS | Status: AC
  Administered 2014-01-27: 1000 mg via INTRAVENOUS
  Filled 2014-01-27: qty 20

## 2014-01-27 MED ORDER — PANTOPRAZOLE SODIUM 40 MG PO TBEC
40.0000 mg | DELAYED_RELEASE_TABLET | Freq: Two times a day (BID) | ORAL | Status: DC
  Administered 2014-01-27: 40 mg via ORAL
  Filled 2014-01-27 (×4): qty 1

## 2014-01-27 NOTE — Progress Notes (Signed)
Pt is refusing to have the SCD on. Will continue to explain its importance to the patient and encourage him to have on.

## 2014-01-27 NOTE — Progress Notes (Signed)
Pt refused all care. Wanted his nurse changed and then did not want the replacement nurse. Delayed blood draws for one hour. Vital signs remained stable throughout the night. Hgb steadily dropping but slowly. Will continue to monitor.  Garvin FilaNewtona Nashya [place], RN

## 2014-01-27 NOTE — Progress Notes (Signed)
Jeremy [name] - Stepdown/ICU TEAM Progress Note  Jeremy [name] WUJ:[id number] DOB: December 27, 1965 DOA: 01/26/2014 PCP: Jeremy [name]  Admit HPI / Brief Narrative: 48 yo male who reported a sore in his mouth for several weeks. He was taking aleve several times per day for a few weeks to alleviate the pain. He used bicarbonate flushes and the lesion in his mouth "popped" and felt better.  He noticed progressive weakness over [name] week.  He presented to the ER on 8/06. He was found to have severe anemia with tachycardia and hypotension. He was seen by GI. He reported having melena previously, but this had resolved. He was also having some abdominal discomfort. He was advised to have transfusion and EGD, but he was worried about risks with these therapies. He was evaluated by Psychiatry and felt competent to make decisions. He wanted something to eat, and left AMA. He got something to eat, and then decided to return for admission.  He stated he was agreeable to IV iron supplementation and IV fluids. If these do not work, then he would be agreeable to blood transfusion and EGD.   Significant events:  8/06 Presented to ED, GI consulted >> left AMA  8/07 Returned to ED, re-admitted but refusing endoscopy or PRBC   HPI/Subjective: Over night pt essentially refused all care.  He also asked for a different RN, then also refused to see the appointed replacement RN. At the time of my visit he is calm and interactive.  He is alert and oriented.  He has not new complaints. He has not yet moved his bowels.  He is tolerating his diet w/o difficulty.    Assessment/Plan:  Acute/subacute blood loss anemia likely 2nd to GI bleeding Hgb is slowly declining - likely somewhat due to dilution w/ IVF, as well as possible ongoing loss - check QD only to avoid loss due to phlebotomy - appears pt was given only a total of 75 mg of infed therefore will order one time 1000 mg dose for a complete load  Suboptimal  B12 Will replace and follow   Hypotension 2nd to hypovolemia and presumed GI bleeding Persists, but w/o tachycardia or renal failure - no further tx options available at this time   Presumed upper GI bleed >> likely related to recent NSAID use PPI BID empirically - if melena persists will push for EGD and consider empiric carafate  Hypokalemia Corrected  Competency At present the pt appears to be competent, to my estimation, though he is clearly making decisions that are not in his best interest from a medical standpoint - nonetheless, I do not feel that we have the right to force on him treatments that he refuses at present   Code Status: FULL Family Communication: no family present at time of exam Disposition Plan: SDU  Consultants: GI PCCM Psychiatry  Procedures: none  Antibiotics: none  DVT prophylaxis: SCDs (but pt refusing to wear)  Objective: Blood pressure 93/51, pulse 104, temperature 98.4 F (36.9 C), temperature source Oral, resp. rate 22, height 5\' 10"  ([name].778 m), weight 70.3 kg (154 lb 15.7 oz), SpO2 97.00%.  Intake/Output Summary (Last 24 hours) at 01/27/14 1538 Last data filed at 01/27/14 1200  Gross per 24 hour  Intake   2330 ml  Output    725 ml  Net   1605 ml   Exam: General: No acute respiratory distress Lungs: Clear to auscultation bilaterally without wheezes or crackles Cardiovascular: Regular rate and rhythm without murmur  gallop or rub normal S1 and S2 Abdomen: Nontender, nondistended, soft, bowel sounds positive, no rebound, no ascites, no appreciable mass Extremities: No significant cyanosis, clubbing, or edema bilateral lower extremities  Data Reviewed: Basic Metabolic Panel:  Recent Labs Lab 01/25/14 1712 01/26/14 0801 01/27/14 0344  NA 134* 137 140  K 3.4* 3.3* 4.0  CL 97 102 108  CO2 23  --  23  GLUCOSE 112* 109* 91  BUN 17 11 10   CREATININE 0.67 0.80 0.77  CALCIUM 8.4  --  7.6*    Liver Function Tests:  Recent  Labs Lab 01/25/14 1712  AST 14  ALT 6  ALKPHOS 79  BILITOT 0.2*  PROT 6.6  ALBUMIN 2.9*   Coags: No results found for this basename: PT, INR,  in the last 168 hours No results found for this basename: PTT,  in the last 168 hours  CBC:  Recent Labs Lab 01/25/14 1712 01/26/14 0728 01/26/14 0801 01/26/14 1213 01/26/14 2146 01/27/14 0344  WBC 16.3* 12.8*  --   --   --  10.[name]  NEUTROABS 11.6* 9.3*  --   --   --   --   HGB 5.4* 4.[name]* 4.4* 3.9* 3.8* 3.7*  HCT 16.6* 12.7* 13.0* 12.0* 11.8* 11.5*  MCV 97.6 96.9  --   --   --  99.[name]  PLT 377 330  --   --   --  314    Recent Results (from the past 240 hour(s))  MRSA PCR SCREENING     Status: None   Collection Time    01/26/14  6:03 PM      Result Value Ref Range Status   MRSA by PCR NEGATIVE  NEGATIVE Final   Comment:            The GeneXpert MRSA Assay (FDA     approved for NASAL specimens     only), is one component of a     comprehensive MRSA colonization     surveillance program. It is not     intended to diagnose MRSA     infection nor to guide or     monitor treatment for     MRSA infections.     Studies:  Recent x-ray studies have been reviewed in detail by the Attending Physician  Scheduled Meds:  Scheduled Meds: . antiseptic oral rinse  7 mL Mouth Rinse BID  . chlorhexidine  15 mL Mouth Rinse BID  . cyanocobalamin  [name],000 mcg Subcutaneous QPC breakfast  . iron dextran (INFED/DEXFERRUM) 1000 MG IVPB  [name],000 mg Intravenous Once  . pantoprazole  40 mg Oral BID AC    Time spent on care of this patient: 35 mins   MCCLUNG,JEFFREY T , [name]   [facility] Office  [phone] Pager - Text Page per Loretha Stapler as per below:  On-Call/Text Page:      [email or web address]      password [email or web address]  If 7PM-7AM, please contact night-coverage [email or web address] Password [email or web address] 01/27/2014, 3:38 PM   LOS: [name] day

## 2014-01-27 NOTE — Progress Notes (Signed)
Pt arguing with staff requesting food  from cafeteria at 2330, when told unable to get food tray at this time and cafeteria is closed pt continues to yell and cuss at staff to get him his food, pt offered a sandwich states he doesn't want that he wants his tray, pt states his doctor said he can get food at anytime and he wants food now. Charge nurse into speak with pt.  Pt request pain med at 2335, Took po med into room pt states let me see that to made sure its the right medication, pt looks at tablets and states this is not percocet this is just tylenol, pt shown tablets in unopened packaging,  with name of medication, pt proceeds to say that he needs something stronger than that. Charge nurse made aware, and into see pt.

## 2014-01-27 NOTE — Progress Notes (Addendum)
Patient WU:[id number]:Jeremy [name]      DOB: 11-Aug-1965      JYN:[id number]  Jeremy [name] is sitting up eating breakfast this morning. Able to converse with me briefly in calm manner. Asks that I let him eat breakfast and okay with me coming back tomorrow.  As addended in note yesterday, while I was not able to ensure he understood medical situation and risks/benefits of agreeing and declining medical interventions, 2 other physicians were able to do so.  My interaction should not trump that, as he was able to display this understanding with them.  He is certainly entitled to make decisions that are poor or would generally not be chosen by most people. He is also agreeable to transfusion and endoscopic evaluation if declines (not sure to what extent), but would at least give some chance to resuscitative efforts to be effective and not potentially inappropriate treatment.   Orvis BrillAaron J. Deniyah Dillavou D.O. Palliative Medicine Team at [facility]  Pager: [phone] Team Phone: [phone]

## 2014-01-27 NOTE — Progress Notes (Signed)
Nutrition Brief Note  Patient identified on the Malnutrition Screening Tool (MST) Report. Patient with some nausea and decreased intake PTA, but patient eating well now. No weight loss reported.   Wt Readings from Last 15 Encounters:  01/27/14 154 lb 15.7 oz (70.3 kg)  01/25/14 160 lb (72.576 kg)  01/25/14 160 lb (72.576 kg)    Body mass index is 22.24 kg/(m^2). Patient meets criteria for normal weight based on current BMI.   Current diet order is Regular, patient is consuming approximately 100% of meals at this time. Labs and medications reviewed.   No nutrition interventions warranted at this time. If nutrition issues arise, please consult RD.   Linnell FullingElyse Sireen Halk, RD, LDN Pager #: [phone] After-Hours Pager #: [phone]

## 2014-01-28 LAB — CBC
HEMATOCRIT: 13.3 % — AB (ref 39.0–52.0)
Hemoglobin: 4.1 g/dL — CL (ref 13.0–17.0)
MCH: 32 pg (ref 26.0–34.0)
MCHC: 30.8 g/dL (ref 30.0–36.0)
MCV: 103.9 fL — ABNORMAL HIGH (ref 78.0–100.0)
PLATELETS: 327 10*3/uL (ref 150–400)
RBC: 1.28 MIL/uL — ABNORMAL LOW (ref 4.22–5.81)
RDW: 23.3 % — ABNORMAL HIGH (ref 11.5–15.5)
WBC: 12.5 10*3/uL — AB (ref 4.0–10.5)

## 2014-01-28 MED ORDER — OXYCODONE HCL 5 MG PO TABS
5.0000 mg | ORAL_TABLET | Freq: Four times a day (QID) | ORAL | Status: DC | PRN
  Administered 2014-01-28 – 2014-01-30 (×6): 5 mg via ORAL
  Filled 2014-01-28 (×6): qty 1

## 2014-01-28 MED ORDER — OXYCODONE HCL 5 MG PO TABS
5.0000 mg | ORAL_TABLET | ORAL | Status: DC | PRN
  Filled 2014-01-28: qty 2

## 2014-01-28 MED ORDER — OXYCODONE-ACETAMINOPHEN 5-325 MG PO TABS
1.0000 | ORAL_TABLET | Freq: Four times a day (QID) | ORAL | Status: DC | PRN
  Administered 2014-01-28 – 2014-01-29 (×5): 2 via ORAL
  Administered 2014-01-30: 1 via ORAL
  Filled 2014-01-28 (×2): qty 2
  Filled 2014-01-28: qty 1
  Filled 2014-01-28 (×3): qty 2

## 2014-01-28 MED ORDER — CHLORHEXIDINE GLUCONATE 0.12 % MT SOLN
15.0000 mL | Freq: Four times a day (QID) | OROMUCOSAL | Status: DC
  Administered 2014-01-28 – 2014-01-29 (×5): 15 mL via OROMUCOSAL
  Filled 2014-01-28 (×12): qty 15

## 2014-01-28 MED ORDER — ACETAMINOPHEN 500 MG PO TABS: 500.0000 mg | ORAL_TABLET | Freq: Four times a day (QID) | ORAL | Status: DC | PRN

## 2014-01-28 NOTE — Progress Notes (Signed)
Pt brushed his teeth and tongue this am with toothbrush, noticed scant amount of bleeding from tongue, states "this is where the blood is coming from, not from the inside"  Pt now talking randomly about snakes and cameras and people doing anything for a paycheck. Stating" Who has more legal proof of where the blood is coming from me or them? That could be going into my stomach. Explained to pt that what he saw is from brushing his teeth and tongue, and dose not explain a signifcant amount of blood loss. Pt is not accepting of explanation. Pt is  A/O x 4.

## 2014-01-28 NOTE — Progress Notes (Signed)
UR Completed.  Jeremy [name] [phone] 01/28/2014  

## 2014-01-28 NOTE — Progress Notes (Signed)
Pt continue to refuse protonix

## 2014-01-28 NOTE — Progress Notes (Signed)
Pt offered protonix, states I don't want that there's nothing wrong with my stomach, you can cancel anything that has to do with my stomach.

## 2014-01-28 NOTE — Progress Notes (Signed)
Patient ZO:XWRUE:Jeremy [name]      DOB: [birth year]      AVW:[id number]  Attempted to see again today.  Much more agitated than yesterday. As soon as I walked in, he asked me to leave.  Reviewing nurse charting, it appears to be a difficult day for him. Refusing medications including protonix. Will at least shadow chart and follow along intermittently if he allows.   Orvis BrillAaron J. Glory Graefe D.O. Palliative Medicine Team at [facility]  Pager: [phone] Team Phone: [phone]
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
## 2014-01-29 LAB — CBC
HCT: 13.5 % — ABNORMAL LOW (ref 39.0–52.0)
Hemoglobin: 4.2 g/dL — CL (ref 13.0–17.0)
MCH: 31.8 pg (ref 26.0–34.0)
MCHC: 31.1 g/dL (ref 30.0–36.0)
MCV: 102.3 fL — ABNORMAL HIGH (ref 78.0–100.0)
Platelets: 374 10*3/uL (ref 150–400)
RBC: 1.32 MIL/uL — ABNORMAL LOW (ref 4.22–5.81)
RDW: 23 % — ABNORMAL HIGH (ref 11.5–15.5)
WBC: 10.6 10*3/uL — ABNORMAL HIGH (ref 4.0–10.5)
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
## 2014-01-29 NOTE — Progress Notes (Signed)
Patient awoken upset this morning, became increasingly upset when I inquired about whether he wanted to take his ordered Protonix this morning. Patient began cursing and yelling about him wanting the Protonix removed from his medication profile immediately and refuses to take any medication ordered by a GI physician("Probe Specialist") because he is not in the hospital for issues related to his stomach. Patient continued to speak about not wanting any interventions such as blood transfusions or any diagnostic GI procedures and stated that he will not allow any nurse to take care of him that mentions anything related to his gastrointestinal system because that is not his problem. Message sent to Tama GanderKatherine  Schorr, NP regarding discontinuing Protonix from the medication profile.

## 2014-01-29 NOTE — Clinical Documentation Improvement (Signed)
Possible Conditions?  Hypovolemic Shock Shock, unspecified Other Not able to determine   Risk Factors: Hypotension secondary to hypovolemia noted per 8/08 progress notes. BP down to 76/52 per 8/07 doc flow sheets. BP down to 73/34 per 8/08 doc flow sheets. BP down to 92/46 per 8/09 doc flow sheets.  Diagnostics: 8/06:  Lactic acid: 2.97.  Thank you,  Marciano SequinWanda Mathews-Bethea,RN,BSN, Clinical Documentation Specialist [phone]  [facility]- Health Information Management

## 2014-01-29 NOTE — Progress Notes (Signed)
[place] TEAM 1 - Stepdown/ICU TEAM Progress Note  Janelle FloorBobby G Granzow UEA:[id number]:[id number] DOB: Feb 17, 1966 DOA: 01/26/2014 PCP: Burtis JunesBLOUNT,ALVIN VINCENT, MD  Admit HPI / Brief Narrative: 48 yo male who reported a sore in his mouth for several weeks. He was taking aleve several times per day for a few weeks to alleviate the pain. He used bicarbonate flushes and the lesion in his mouth "popped" and felt better.  He noticed progressive weakness over 1 week.  He presented to the ER on 8/06. He was found to have severe anemia with tachycardia and hypotension. He was seen by GI. He reported having melena previously, but this had resolved. He was also having some abdominal discomfort. He was advised to have transfusion and EGD, but he was worried about risks with these therapies. He was evaluated by Psychiatry and felt competent to make decisions. He wanted something to eat, and left AMA. He got something to eat, and then decided to return for admission.  He stated he was agreeable to IV iron supplementation and IV fluids. If these do not work, then he would be agreeable to blood transfusion and EGD.   Significant events:  8/06 Presented to ED, GI consulted >> left AMA  8/07 Returned to ED, re-admitted but refusing endoscopy or PRBC   HPI/Subjective: No new comlaints today, but is diaphoretic just laying in bed.  Denies feeling hot or cold.  Denies cp, sob, or ha.    Assessment/Plan:  Acute/subacute blood loss anemia likely 2nd to GI bleeding Hgb appears to have stabilized somewhat - check QD only to avoid loss due to phlebotomy - has now been loaded w/ 1000mg  infed, and aranesp started - no further tx options available that pt will allow - if Hgb remains stable tomorrow pt could be d/c home as he appears to be HD stable   Suboptimal B12 replace and follow   Hypotension 2nd to hypovolemia and presumed GI bleeding BP has improved, and HR has declined, but suspect today's diaphoresis is a result of his hypovolemia  - will increase hydration gently - no further tx options available at this time as pt refuses the tx he really needs (transfusion)  Presumed upper GI bleed >> likely related to recent NSAID use PPI BID empirically, but pt often refuses - if melena persists will push for EGD and consider empiric carafate - no evidence of melena since admit   Hypokalemia Corrected  Competency At present the pt appears to be competent, to my estimation, though he is clearly making decisions that are not in his best interest from a medical standpoint - nonetheless, I do not feel that we have the right to force on him treatments that he refuses at this time   Code Status: FULL Family Communication: no family present at time of exam Disposition Plan: SDU  Consultants: GI PCCM Psychiatry  Procedures: none  Antibiotics: none  DVT prophylaxis: SCDs (but pt refusing to wear)  Objective: Blood pressure 111/66, pulse 93, temperature 98.3 F (36.8 C), temperature source Oral, resp. rate 20, height 5\' 10"  (1.778 m), weight 70.3 kg (154 lb 15.7 oz), SpO2 100.00%.  Intake/Output Summary (Last 24 hours) at 01/29/14 1515 Last data filed at 01/29/14 1321  Gross per 24 hour  Intake   1980 ml  Output   1550 ml  Net    430 ml   Exam: General: No acute respiratory distress - diaphoretic but alert and not in apparent distress  Lungs: Clear to auscultation bilaterally without wheezes  or crackles Cardiovascular: Regular rhythm and rate without murmur gallop or rub normal S1 and S2 Abdomen: Nontender, nondistended, soft, bowel sounds positive, no rebound, no ascites, no appreciable mass Extremities: No significant cyanosis, clubbing, or edema bilateral lower extremities  Data Reviewed: Basic Metabolic Panel:  Recent Labs Lab 01/25/14 1712 01/26/14 0801 01/27/14 0344  NA 134* 137 140  K 3.4* 3.3* 4.0  CL 97 102 108  CO2 23  --  23  GLUCOSE 112* 109* 91  BUN 17 11 10   CREATININE 0.67 0.80 0.77    CALCIUM 8.4  --  7.6*   Liver Function Tests:  Recent Labs Lab 01/25/14 1712  AST 14  ALT 6  ALKPHOS 79  BILITOT 0.2*  PROT 6.6  ALBUMIN 2.9*   Coags: No results found for this basename: PT, INR,  in the last 168 hours No results found for this basename: PTT,  in the last 168 hours  CBC:  Recent Labs Lab 01/25/14 1712 01/26/14 0728  01/26/14 1213 01/26/14 2146 01/27/14 0344 01/28/14 0345 01/29/14 0310  WBC 16.3* 12.8*  --   --   --  10.1 12.5* 10.6*  NEUTROABS 11.6* 9.3*  --   --   --   --   --   --   HGB 5.4* 4.1*  < > 3.9* 3.8* 3.7* 4.1* 4.2*  HCT 16.6* 12.7*  < > 12.0* 11.8* 11.5* 13.3* 13.5*  MCV 97.6 96.9  --   --   --  99.1 103.9* 102.3*  PLT 377 330  --   --   --  314 327 374  < > = values in this interval not displayed.  Recent Results (from the past 240 hour(s))  MRSA PCR SCREENING     Status: None   Collection Time    01/26/14  6:03 PM      Result Value Ref Range Status   MRSA by PCR NEGATIVE  NEGATIVE Final   Comment:            The GeneXpert MRSA Assay (FDA     approved for NASAL specimens     only), is one component of a     comprehensive MRSA colonization     surveillance program. It is not     intended to diagnose MRSA     infection nor to guide or     monitor treatment for     MRSA infections.     Studies:  Recent x-ray studies have been reviewed in detail by the Attending Physician  Scheduled Meds:  Scheduled Meds: . chlorhexidine  15 mL Mouth/Throat QID  . cyanocobalamin  1,000 mcg Subcutaneous QPC breakfast  . darbepoetin (ARANESP) injection - NON-DIALYSIS  100 mcg Subcutaneous Weekly  . pantoprazole  40 mg Oral BID AC    Time spent on care of this patient: 25 mins   Thompson Mckim T , MD   [facility] Office  [phone] Pager - Text Page per Loretha Stapler as per below:  On-Call/Text Page:      [email or web address]      password [email or web address]  If 7PM-7AM, please contact night-coverage [email or web address] Password [email or web address] 01/29/2014, 3:15  PM   LOS: 3 days

## 2014-01-29 NOTE — Progress Notes (Signed)
Patient requested pain medicine.  Went to inform patient his medicine would soon be due to be given.  He became verbally abusive, upset that he was asked questions assessing his orientation nearer the beginning of the shift.  Instructed nurse to get out and denied treatment of pain.  Assured patient I would be available if needed, instructing him to call.  Will continue to monitor patient.

## 2014-01-30 DIAGNOSIS — K922 Gastrointestinal hemorrhage, unspecified: Secondary | ICD-10-CM

## 2014-01-30 DIAGNOSIS — D649 Anemia, unspecified: Secondary | ICD-10-CM

## 2014-01-30 DIAGNOSIS — I959 Hypotension, unspecified: Secondary | ICD-10-CM

## 2014-01-30 DIAGNOSIS — D62 Acute posthemorrhagic anemia: Principal | ICD-10-CM

## 2014-01-30 LAB — COMPREHENSIVE METABOLIC PANEL
ALT: 6 U/L (ref 0–53)
ANION GAP: 9 (ref 5–15)
AST: 15 U/L (ref 0–37)
Albumin: 2 g/dL — ABNORMAL LOW (ref 3.5–5.2)
Alkaline Phosphatase: 68 U/L (ref 39–117)
BUN: 7 mg/dL (ref 6–23)
CHLORIDE: 110 meq/L (ref 96–112)
CO2: 23 mEq/L (ref 19–32)
Calcium: 7.7 mg/dL — ABNORMAL LOW (ref 8.4–10.5)
Creatinine, Ser: 0.74 mg/dL (ref 0.50–1.35)
GLUCOSE: 95 mg/dL (ref 70–99)
POTASSIUM: 3.9 meq/L (ref 3.7–5.3)
Sodium: 142 mEq/L (ref 137–147)
TOTAL PROTEIN: 5.1 g/dL — AB (ref 6.0–8.3)

## 2014-01-30 LAB — CBC
HCT: 13.8 % — ABNORMAL LOW (ref 39.0–52.0)
Hemoglobin: 4.3 g/dL — CL (ref 13.0–17.0)
MCH: 32.6 pg (ref 26.0–34.0)
MCHC: 31.2 g/dL (ref 30.0–36.0)
MCV: 104.5 fL — ABNORMAL HIGH (ref 78.0–100.0)
Platelets: 405 10*3/uL — ABNORMAL HIGH (ref 150–400)
RBC: 1.32 MIL/uL — ABNORMAL LOW (ref 4.22–5.81)
RDW: 24.2 % — ABNORMAL HIGH (ref 11.5–15.5)
WBC: 12.6 10*3/uL — ABNORMAL HIGH (ref 4.0–10.5)

## 2014-01-30 NOTE — Discharge Summary (Signed)
Physician Discharge Summary  Jeremy [name]:[id number] DOB: 13-Mar-1966 DOA: 01/26/2014  PCP: Burtis Junes, MD  Admit date: 01/26/2014 Discharge date: 01/30/2014  Time spent:40 minutes  Recommendations for Outpatient Follow-up:  GI bleed -Patient has refused all requests by multiple physicians to allow a workup to proceed.  -Patient has been advised that his hemoglobin is extremely low and that he should proceed with a workup. Patient has stated he will not allow workup, we'll not allow transfusion of blood products.  -followup with Dr. Francee Piccolo by: Patient refuses to divulge patient's first name or specialty. Patient to followup with Dr. Francee Piccolo  Acute blood loss anemia -Patient aware that his hemoglobin is extremely low however refuses any workup or treatment at this time -Address with PCP  Hypotension -Resolved -Address with PCP  Depressive disorder NOS/delusional disorder -Patient evaluated on 8/7 by Dr Darrol Jump ( Psychiatry), and was ruled competent to make his own medical decisions and living arrangements    Discharge Diagnoses:  Active Problems:   GI bleed   Acute blood loss anemia   Hypotension   Discharge Condition: Stable  Diet recommendation: Regular  Filed Weights   01/26/14 1801 01/27/14 0637  Weight: 64 kg (141 lb 1.5 oz) 70.3 kg (154 lb 15.7 oz)    History of present illness:  48 yo BM PMHx GI bleed, hypotension, noncompliance. Reported a sore in his mouth for several weeks. He was taking aleve several times per day for a few weeks to alleviate the pain. He used bicarbonate flushes and the lesion in his mouth "popped" and felt better. He noticed progressive weakness over 1 week. He presented to the ER on 8/06. He was found to have severe anemia with tachycardia and hypotension. He was seen by GI. He reported having melena previously, but this had resolved. He was also having some abdominal discomfort. He was advised to have transfusion  and EGD, but he was worried about risks with these therapies. He was evaluated by Psychiatry and felt competent to make decisions. He wanted something to eat, and left AMA. He got something to eat, and then decided to return for admission.  He stated he was agreeable to IV iron supplementation and IV fluids. If these do not work, then he would be agreeable to blood transfusion and EGD. Patient has been belligerent and noncompliant with medical staff (see chart). On 8/11 after introducing myself asked patient if he would reconsider seeing the GI physician secondary to his GI bleed; explained to patient that hemoglobin is 4 which is about 1/3 the amount an adult male should have. Stated he did not have a GI bleed, did not want to see a GI physician, did not want to have any blood products. Stated he had his own physician (Dr. Francee Piccolo ) and that would be the person he would see and listen to.   Procedures: NA    Consultations: Dr. Arabella Merles (palliative care) Dr Darrol Jump ( Psychiatry)    Antibiotics NA   Discharge Exam: Filed Vitals:   01/29/14 1600 01/29/14 1630 01/29/14 1700 01/29/14 2026  BP: 102/56 102/56  131/47  Pulse: 90 91    Temp: 98.9 F (37.2 C)   98.5 F (36.9 C)  TempSrc: Oral   Oral  Resp: 24 25 43 47  Height:      Weight:      SpO2: 98% 99%      General: A./O. x4, NAD, belligerent Cardiovascular: Regular rhythm and rate, negative murmurs rubs gallops, normal  S1/S2 Respiratory: Clear to auscultation bilateral  Discharge Instructions     Medication List    Notice   You have not been prescribed any medications.     No Known Allergies    The results of significant diagnostics from this hospitalization (including imaging, microbiology, ancillary and laboratory) are listed below for reference.    Significant Diagnostic Studies: No results found.  Microbiology: Recent Results (from the past 240 hour(s))  MRSA PCR SCREENING     Status:  None   Collection Time    01/26/14  6:03 PM      Result Value Ref Range Status   MRSA by PCR NEGATIVE  NEGATIVE Final   Comment:            The GeneXpert MRSA Assay (FDA     approved for NASAL specimens     only), is one component of a     comprehensive MRSA colonization     surveillance program. It is not     intended to diagnose MRSA     infection nor to guide or     monitor treatment for     MRSA infections.     Labs: Basic Metabolic Panel:  Recent Labs Lab 01/25/14 1712 01/26/14 0801 01/27/14 0344 01/30/14 0257  NA 134* 137 140 142  K 3.4* 3.3* 4.0 3.9  CL 97 102 108 110  CO2 23  --  23 23  GLUCOSE 112* 109* 91 95  BUN 17 11 10 7   CREATININE 0.67 0.80 0.77 0.74  CALCIUM 8.4  --  7.6* 7.7*   Liver Function Tests:  Recent Labs Lab 01/25/14 1712 01/30/14 0257  AST 14 15  ALT 6 6  ALKPHOS 79 68  BILITOT 0.2* <0.2*  PROT 6.6 5.1*  ALBUMIN 2.9* 2.0*   No results found for this basename: LIPASE, AMYLASE,  in the last 168 hours No results found for this basename: AMMONIA,  in the last 168 hours CBC:  Recent Labs Lab 01/25/14 1712 01/26/14 0728  01/26/14 2146 01/27/14 0344 01/28/14 0345 01/29/14 0310 01/30/14 0257  WBC 16.3* 12.8*  --   --  10.1 12.5* 10.6* 12.6*  NEUTROABS 11.6* 9.3*  --   --   --   --   --   --   HGB 5.4* 4.1*  < > 3.8* 3.7* 4.1* 4.2* 4.3*  HCT 16.6* 12.7*  < > 11.8* 11.5* 13.3* 13.5* 13.8*  MCV 97.6 96.9  --   --  99.1 103.9* 102.3* 104.5*  PLT 377 330  --   --  314 327 374 405*  < > = values in this interval not displayed. Cardiac Enzymes: No results found for this basename: CKTOTAL, CKMB, CKMBINDEX, TROPONINI,  in the last 168 hours BNP: BNP (last 3 results) No results found for this basename: PROBNP,  in the last 8760 hours CBG: No results found for this basename: GLUCAP,  in the last 168 hours     Signed:  Carolyne Littlesurtis Woods, MD [facility] [phone] pager

## 2014-01-30 NOTE — Progress Notes (Signed)
Patient ZO:XWRUE:Jeremy [name]      DOB: 1965/09/16      AVW:[id number]  Jeremy [name] allowed me to talk with him briefly today. He wants to go home today.  Talked about how EGD that has been suggested would not "find blood" because counts are too low.  He equated this to a car having a leak and not being able to find the leak unless it was full of fluid.  Repeatedly tells me to not rely on machines as they should not get paid before me.  Difficult to divert from this topic.  Will continue to shadow chart while here, but feel free to call us if other questions arise.   Orvis BrillAaron J. Calianna Kim D.O. Palliative Medicine Team at [facility]  Pager: [phone] Team Phone: [phone]

## 2014-01-30 NOTE — Progress Notes (Signed)
PT Cancellation Note  Patient Details Name: Jeremy [name] MRN: [id number] DOB: 01/26/1966   Cancelled Treatment:    Reason Eval/Treat Not Completed: Medical issues which prohibited therapy.  PT eval received, chart reviewed. Hg is at a critical value of 4.3. Does not appear that pt will allow transfusion at this time. Will continue to follow and evaluate when able.    Ruthann CancerHamilton, Jadesola Poynter 01/30/2014, 8:52 AM  Ruthann CancerLaura Hamilton, PT, DPT Acute Rehabilitation Services Pager: [phone]

## 2014-01-30 NOTE — Progress Notes (Signed)
Pt is being discharged to home per md orders. Pt's iv removed, tip intact and pt tolerated well. Pt is alert and oriented x 4. Pt's vitals within normal limits. Pt has been voiding in urinal. Pt given discharge papers and reviewed with nurse. Pt has no home medications or prescriptions but states he will follow up with his doctor. Pt states he is calling his mother to come pick him up.

## 2014-02-05 ENCOUNTER — Emergency Department (HOSPITAL_COMMUNITY)
Admission: EM | Admit: 2014-02-05 | Discharge: 2014-02-05 | Disposition: A | Discharge status: Home / Self Care | Payer: Self-pay | Attending: Emergency Medicine | Admitting: Emergency Medicine

## 2014-02-05 ENCOUNTER — Emergency Department (HOSPITAL_COMMUNITY): Payer: Self-pay

## 2014-02-05 ENCOUNTER — Encounter (HOSPITAL_COMMUNITY): Payer: Self-pay | Admitting: Emergency Medicine

## 2014-02-05 ENCOUNTER — Emergency Department (HOSPITAL_COMMUNITY)
Admission: EM | Admit: 2014-02-05 | Discharge: 2014-02-05 | Discharge status: Against Medical Advice | Payer: Self-pay | Attending: Emergency Medicine | Admitting: Emergency Medicine

## 2014-02-05 DIAGNOSIS — L03211 Cellulitis of face: Principal | ICD-10-CM

## 2014-02-05 DIAGNOSIS — Z791 Long term (current) use of non-steroidal anti-inflammatories (NSAID): Secondary | ICD-10-CM | POA: Insufficient documentation

## 2014-02-05 DIAGNOSIS — F17200 Nicotine dependence, unspecified, uncomplicated: Secondary | ICD-10-CM | POA: Insufficient documentation

## 2014-02-05 DIAGNOSIS — L0201 Cutaneous abscess of face: Secondary | ICD-10-CM | POA: Insufficient documentation

## 2014-02-05 DIAGNOSIS — L0291 Cutaneous abscess, unspecified: Secondary | ICD-10-CM

## 2014-02-05 DIAGNOSIS — Z87442 Personal history of urinary calculi: Secondary | ICD-10-CM | POA: Insufficient documentation

## 2014-02-05 LAB — CBC
HCT: 21.6 % — ABNORMAL LOW (ref 39.0–52.0)
Hemoglobin: 6.7 g/dL — CL (ref 13.0–17.0)
MCH: 30.7 pg (ref 26.0–34.0)
MCHC: 31 g/dL (ref 30.0–36.0)
MCV: 99.1 fL (ref 78.0–100.0)
Platelets: 700 10*3/uL — ABNORMAL HIGH (ref 150–400)
RBC: 2.18 MIL/uL — ABNORMAL LOW (ref 4.22–5.81)
RDW: 19.3 % — AB (ref 11.5–15.5)
WBC: 12.2 10*3/uL — ABNORMAL HIGH (ref 4.0–10.5)

## 2014-02-05 LAB — BASIC METABOLIC PANEL
ANION GAP: 12 (ref 5–15)
BUN: 8 mg/dL (ref 6–23)
CALCIUM: 8.9 mg/dL (ref 8.4–10.5)
CO2: 27 mEq/L (ref 19–32)
CREATININE: 0.66 mg/dL (ref 0.50–1.35)
Chloride: 103 mEq/L (ref 96–112)
Glucose, Bld: 100 mg/dL — ABNORMAL HIGH (ref 70–99)
Potassium: 3.9 mEq/L (ref 3.7–5.3)
Sodium: 142 mEq/L (ref 137–147)

## 2014-02-05 MED ORDER — ACETAMINOPHEN 325 MG PO TABS
650.0000 mg | ORAL_TABLET | Freq: Once | ORAL | Status: AC
  Administered 2014-02-05: 650 mg via ORAL
  Filled 2014-02-05: qty 2

## 2014-02-05 MED ORDER — FERROUS SULFATE 325 (65 FE) MG PO TABS: 325.0000 mg | ORAL_TABLET | Freq: Every day | ORAL | Status: DC

## 2014-02-05 MED ORDER — ONDANSETRON 4 MG PO TBDP
8.0000 mg | ORAL_TABLET | Freq: Once | ORAL | Status: AC
  Administered 2014-02-05: 8 mg via ORAL
  Filled 2014-02-05: qty 2

## 2014-02-05 MED ORDER — OXYCODONE-ACETAMINOPHEN 5-325 MG PO TABS: 1.0000 | ORAL_TABLET | Freq: Four times a day (QID) | ORAL | Status: DC | PRN

## 2014-02-05 MED ORDER — IOHEXOL 300 MG/ML  SOLN
80.0000 mL | Freq: Once | INTRAMUSCULAR | Status: AC | PRN
  Administered 2014-02-05: 80 mL via INTRAVENOUS

## 2014-02-05 MED ORDER — SODIUM CHLORIDE 0.9 % IV SOLN
3.0000 g | Freq: Once | INTRAVENOUS | Status: AC
  Administered 2014-02-05: 3 g via INTRAVENOUS
  Filled 2014-02-05: qty 3

## 2014-02-05 MED ORDER — BISACODYL 5 MG PO TBEC: 5.0000 mg | DELAYED_RELEASE_TABLET | Freq: Every day | ORAL | Status: DC | PRN

## 2014-02-05 MED ORDER — CLINDAMYCIN HCL 300 MG PO CAPS: 300.0000 mg | ORAL_CAPSULE | Freq: Three times a day (TID) | ORAL | Status: DC

## 2014-02-05 MED ORDER — CLINDAMYCIN HCL 300 MG PO CAPS: 300.0000 mg | ORAL_CAPSULE | Freq: Four times a day (QID) | ORAL | Status: DC

## 2014-02-05 MED ORDER — MORPHINE SULFATE 4 MG/ML IJ SOLN
6.0000 mg | Freq: Once | INTRAMUSCULAR | Status: DC
  Filled 2014-02-05: qty 2

## 2014-02-05 NOTE — ED Notes (Signed)
MD at bedside to drain wound on chin.

## 2014-02-05 NOTE — ED Notes (Signed)
Pt has swelling to lower face and draining chin lesion x 3 days may have bad upper tooth also states was seen here recently for electrocution and low hbg took iron for low hgb and then states was sent home

## 2014-02-05 NOTE — ED Provider Notes (Signed)
CSN: [id number]     Arrival date & time 02/05/14  0909 History   First MD Initiated Contact with Patient 02/05/14 0912     Chief Complaint  Patient presents with  . Wound Infection   HPI Jeremy [name] is a 48 yo man here because of an oral abscess. He states that he first noticed the abscess in his chin area on December 24, 2013, and that he has managed it with an oral antibiotic spray. It is painful and drains. He does not think it has gotten any better. He denies any recent fevers or chills.   Of note, the patient also mentions recent fatigue and a recent hospitalization (01/25/14). The patient was hospitalized for anemia  (hgb to 3.7) following high aleve use. He refused transfusion and EGD during the hospitalization and was deemed to have capacity to make his own medical decisions. Today, he again states that he will refuse transfusion, preferring outpatient iron supplementation (IV) and no workup.  Past Medical History  Diagnosis Date  . Kidney stones    History reviewed. No pertinent past surgical history. No family history on file. History  Substance Use Topics  . Smoking status: Current Every Day Smoker    Types: Cigars  . Smokeless tobacco: Not on file  . Alcohol Use: No    Review of Systems General: weak for the last 2 months Skin: patchy depigmentation HEENT: see HPI for abscess, no headaches, no changes in vision Cardiac: no chest pain, no palpitations Respiratory: no SOB, no wheezes GI: no changes in BM Urinary: no changes in urination Msk: no joint pain or muscle pain Endocrine: no temperature intolerance Psychiatric: some cognitive impairment noted in previous notes   Allergies  Review of patient's allergies indicates no known allergies.  Home Medications   Prior to Admission medications   Medication Sig Start Date End Date Taking? Authorizing Provider  ibuprofen (ADVIL,MOTRIN) 200 MG tablet Take 200 mg by mouth every 6 (six) hours as needed for fever, headache or  mild pain.   Yes Historical Provider, MD  naproxen sodium (ANAPROX) 220 MG tablet Take 220 mg by mouth 2 (two) times daily as needed (for pain).   Yes Historical Provider, MD  OVER THE COUNTER MEDICATION Take 5 mLs by mouth every 6 (six) hours as needed (for cough, [employer] brand of cough syrup).   Yes Historical Provider, MD   There were no vitals taken for this visit. Physical Exam Appearance: in NAD, sitting up in bed with gauze pressed up against chin HEENT: open lesion, 0.5 cm diameter, actively purulent, produces more pus with pressure to area, multiple mouth ulcers at base of gums, terrible dentition (active caries visible) Heart: RRR, normal S1S2,  Lungs: CTAB, no WRR Abdomen: BS+, no tenderness to palpation Musculoskeletal: no pain on passive movement Extremities: no edema Neurologic: A&O but unsound in his medical reasoning Skin: patchy areas of decreased pigmentation throughout body   ED Course  Procedures (including critical care time) Labs Review Labs Reviewed - No data to display  Imaging Review No results found.   EKG Interpretation None      MDM   Final diagnoses:  None    Jeremy [name] is here for treatment of his oral abscess which is draining from the under side of his chin. He also had a recent hospitalization with workup for his severe anemia, but he continues to refuse workup or transfusion for this anemia at this time. Of note, his hemoglobin has risen significantly since  discharge. 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 We opened the lesion today using suture kit dull scissors (did not need incision) and were able to drain the abscess.   - home on oral iron supplementation - home on clindamycin - dental recommendations given - oral surgery follow-up given - discussed signs of infection, worsening abscess    Drucilla Schmidt, MD 02/05/14 1252

## 2014-02-05 NOTE — ED Notes (Addendum)
Critical Hemoglobin 6.7 per Zelda in Lab. Dr. Patria Maneampos made aware.

## 2014-02-05 NOTE — Discharge Instructions (Signed)
Abscess °An abscess is an infected area that contains a collection of pus and debris. It can occur in almost any part of the body. An abscess is also known as a furuncle or boil. °CAUSES  °An abscess occurs when tissue gets infected. This can occur from blockage of oil or sweat glands, infection of hair follicles, or a minor injury to the skin. As the body tries to fight the infection, pus collects in the area and creates pressure under the skin. This pressure causes pain. People with weakened immune systems have difficulty fighting infections and get certain abscesses more often.  °SYMPTOMS °Usually an abscess develops on the skin and becomes a painful mass that is red, warm, and tender. If the abscess forms under the skin, you may feel a moveable soft area under the skin. Some abscesses break open (rupture) on their own, but most will continue to get worse without care. The infection can spread deeper into the body and eventually into the bloodstream, causing you to feel ill.  °DIAGNOSIS  °Your caregiver will take your medical history and perform a physical exam. A sample of fluid may also be taken from the abscess to determine what is causing your infection. °TREATMENT  °Your caregiver may prescribe antibiotic medicines to fight the infection. However, taking antibiotics alone usually does not cure an abscess. Your caregiver may need to make a small cut (incision) in the abscess to drain the pus. In some cases, gauze is packed into the abscess to reduce pain and to continue draining the area. °HOME CARE INSTRUCTIONS  °· Only take over-the-counter or prescription medicines for pain, discomfort, or fever as directed by your caregiver. °· If you were prescribed antibiotics, take them as directed. Finish them even if you start to feel better. °· If gauze is used, follow your caregiver's directions for changing the gauze. °· To avoid spreading the infection: °· Keep your draining abscess covered with a  bandage. °· Wash your hands well. °· Do not share personal care items, towels, or whirlpools with others. °· Avoid skin contact with others. °· Keep your skin and clothes clean around the abscess. °· Keep all follow-up appointments as directed by your caregiver. °SEEK MEDICAL CARE IF:  °· You have increased pain, swelling, redness, fluid drainage, or bleeding. °· You have muscle aches, chills, or a general ill feeling. °· You have a fever. °MAKE SURE YOU:  °· Understand these instructions. °· Will watch your condition. °· Will get help right away if you are not doing well or get worse. °Document Released: 03/18/2005 Document Revised: 12/08/2011 Document Reviewed: 08/21/2011 °[employer]® Patient Information ©2015 [employer]. This information is not intended to replace advice given to you by your health care provider. Make sure you discuss any questions you have with your health care provider. ° °Abscess °Care After °An abscess (also called a boil or furuncle) is an infected area that contains a collection of pus. Signs and symptoms of an abscess include pain, tenderness, redness, or hardness, or you may feel a moveable soft area under your skin. An abscess can occur anywhere in the body. The infection may spread to surrounding tissues causing cellulitis. A cut (incision) by the surgeon was made over your abscess and the pus was drained out. Gauze may have been packed into the space to provide a drain that will allow the cavity to heal from the inside outwards. The boil may be painful for 5 to 7 days. Most people with a boil do not have   high fevers. Your abscess, if seen early, may not have localized, and may not have been lanced. If not, another appointment may be required for this if it does not get better on its own or with medications. °HOME CARE INSTRUCTIONS  °· Only take over-the-counter or prescription medicines for pain, discomfort, or fever as directed by your caregiver. °· When you bathe, soak and then  remove gauze or iodoform packs at least daily or as directed by your caregiver. You may then wash the wound gently with mild soapy water. Repack with gauze or do as your caregiver directs. °SEEK IMMEDIATE MEDICAL CARE IF:  °· You develop increased pain, swelling, redness, drainage, or bleeding in the wound site. °· You develop signs of generalized infection including muscle aches, chills, fever, or a general ill feeling. °· An oral temperature above 102° F (38.9° C) develops, not controlled by medication. °See your caregiver for a recheck if you develop any of the symptoms described above. If medications (antibiotics) were prescribed, take them as directed. °Document Released: 12/25/2004 Document Revised: 08/31/2011 Document Reviewed: 08/22/2007 °Exit[employer]® Patient Information ©2015 [employer]. This information is not intended to replace advice given to you by your health care provider. Make sure you discuss any questions you have with your health care provider. ° °

## 2014-02-05 NOTE — ED Notes (Signed)
PT comfortable with discharge and follow up instructions. Prescriptions x2. 

## 2014-02-05 NOTE — ED Provider Notes (Signed)
I saw and evaluated the patient, reviewed the resident's note and I agree with the findings and plan.   EKG Interpretation None      INCISION AND DRAINAGE Performed by: Lyanne CoAMPOS,Samuell Knoble M Consent: Verbal consent obtained. Risks and benefits: risks, benefits and alternatives were discussed Time out performed prior to procedure Type: abscess Body area: chin Anesthesia: local infiltration Incision was made with a scalpel. Local anesthetic: lidocaine 2% with epinephrine Anesthetic total: 8 ml Complexity: complex Blunt dissection to break up loculations Drainage: purulent Drainage amount: small Packing material: none Patient tolerance: Patient tolerated the procedure well with no immediate complications.    We had a long discussion with patient regarding his ongoing anemia although it is improved from prior.  This is likely iron deficient anemia.  He refuses blood transfusion at this time.  He understands that anemia by itself can cause other pathology such as congestive heart failure and could also including death.  Patient understands this and again refuses blood transfusion.  We will refer him to the hematologist.  We will place him on oral iron supplementation.  His prior laboratories would suggest iron deficiency anemia and he seems to be responding after his IV iron therapy in the hospital.  Regarding his abscess was incised and drained at the bedside.  Additional pus was removed.  He was given antibiotics in the emergency department.  He'll be sent home on clindamycin.  He will followup with oral surgery.  He understands the importance of close followup with oral surgery as he'll likely need several teeth pulled and ongoing antibiotics for severe osteo-.  There is no signs to suggest Ludwig angina at this time.  He is breathing and talking normally.  He understands return to the ER for new or worsening symptoms including fevers or chills or worsening swelling of his chin her movement of the  swelling onto his anterior neck   Lyanne CoKevin M Samanthan Dugo, MD 02/05/14 1321

## 2014-02-05 NOTE — ED Notes (Deleted)
Pt has swelling in lower face and a hole in his chin that has been draining x a couple of days states was seen here a while back for electrocution  And low hgb and feels like his hgb is low agagin

## 2014-02-14 ENCOUNTER — Ambulatory Visit: Payer: Self-pay | Admitting: Family Medicine

## 2014-02-16 ENCOUNTER — Emergency Department (HOSPITAL_COMMUNITY): Payer: Self-pay

## 2014-02-16 ENCOUNTER — Emergency Department (HOSPITAL_COMMUNITY)
Admission: EM | Admit: 2014-02-16 | Discharge: 2014-02-16 | Disposition: A | Discharge status: Home / Self Care | Payer: Self-pay | Attending: Emergency Medicine | Admitting: Emergency Medicine

## 2014-02-16 ENCOUNTER — Encounter (HOSPITAL_COMMUNITY): Payer: Self-pay | Admitting: Emergency Medicine

## 2014-02-16 DIAGNOSIS — D649 Anemia, unspecified: Secondary | ICD-10-CM

## 2014-02-16 DIAGNOSIS — F17200 Nicotine dependence, unspecified, uncomplicated: Secondary | ICD-10-CM | POA: Insufficient documentation

## 2014-02-16 DIAGNOSIS — R112 Nausea with vomiting, unspecified: Secondary | ICD-10-CM | POA: Insufficient documentation

## 2014-02-16 DIAGNOSIS — Z87442 Personal history of urinary calculi: Secondary | ICD-10-CM | POA: Insufficient documentation

## 2014-02-16 DIAGNOSIS — Z791 Long term (current) use of non-steroidal anti-inflammatories (NSAID): Secondary | ICD-10-CM | POA: Insufficient documentation

## 2014-02-16 DIAGNOSIS — R109 Unspecified abdominal pain: Secondary | ICD-10-CM | POA: Insufficient documentation

## 2014-02-16 LAB — BASIC METABOLIC PANEL
Anion gap: 13 (ref 5–15)
BUN: 14 mg/dL (ref 6–23)
CO2: 25 mEq/L (ref 19–32)
Calcium: 8.9 mg/dL (ref 8.4–10.5)
Chloride: 98 mEq/L (ref 96–112)
Creatinine, Ser: 0.7 mg/dL (ref 0.50–1.35)
GFR calc Af Amer: >90 mL/min (ref >90)
GLUCOSE: 94 mg/dL (ref 70–99)
POTASSIUM: 3.9 meq/L (ref 3.7–5.3)
SODIUM: 136 meq/L — AB (ref 137–147)

## 2014-02-16 LAB — URINE MICROSCOPIC-ADD ON

## 2014-02-16 LAB — CBC WITH DIFFERENTIAL/PLATELET
BASOS ABS: 0 10*3/uL (ref 0.0–0.1)
Basophils Relative: 0 % (ref 0–1)
EOS ABS: 0.1 10*3/uL (ref 0.0–0.7)
Eosinophils Relative: 0 % (ref 0–5)
HCT: 30.1 % — ABNORMAL LOW (ref 39.0–52.0)
Hemoglobin: 9.5 g/dL — ABNORMAL LOW (ref 13.0–17.0)
LYMPHS PCT: 17 % (ref 12–46)
Lymphs Abs: 2 10*3/uL (ref 0.7–4.0)
MCH: 29 pg (ref 26.0–34.0)
MCHC: 31.6 g/dL (ref 30.0–36.0)
MCV: 91.8 fL (ref 78.0–100.0)
Monocytes Absolute: 0.9 10*3/uL (ref 0.1–1.0)
Monocytes Relative: 7 % (ref 3–12)
NEUTROS PCT: 76 % (ref 43–77)
Neutro Abs: 8.9 10*3/uL — ABNORMAL HIGH (ref 1.7–7.7)
PLATELETS: 837 10*3/uL — AB (ref 150–400)
RBC: 3.28 MIL/uL — ABNORMAL LOW (ref 4.22–5.81)
RDW: 17.5 % — AB (ref 11.5–15.5)
WBC: 11.9 10*3/uL — AB (ref 4.0–10.5)

## 2014-02-16 LAB — URINALYSIS, ROUTINE W REFLEX MICROSCOPIC
GLUCOSE, UA: NEGATIVE mg/dL
Hgb urine dipstick: NEGATIVE
Ketones, ur: >80 mg/dL — AB
LEUKOCYTES UA: NEGATIVE
NITRITE: NEGATIVE
Protein, ur: 30 mg/dL — AB
Specific Gravity, Urine: 1.034 — ABNORMAL HIGH (ref 1.005–1.030)
Urobilinogen, UA: 1 mg/dL (ref 0.0–1.0)
pH: 6.5 (ref 5.0–8.0)

## 2014-02-16 MED ORDER — PROMETHAZINE HCL 25 MG PO TABS: 25.0000 mg | ORAL_TABLET | Freq: Four times a day (QID) | ORAL | Status: DC | PRN

## 2014-02-16 MED ORDER — ONDANSETRON HCL 4 MG/2ML IJ SOLN
4.0000 mg | Freq: Once | INTRAMUSCULAR | Status: AC
  Administered 2014-02-16: 4 mg via INTRAVENOUS
  Filled 2014-02-16: qty 2

## 2014-02-16 MED ORDER — MORPHINE SULFATE 4 MG/ML IJ SOLN
4.0000 mg | Freq: Once | INTRAMUSCULAR | Status: AC
Start: 2014-02-16 — End: 2014-02-16
  Administered 2014-02-16: 4 mg via INTRAVENOUS
  Filled 2014-02-16: qty 1

## 2014-02-16 MED ORDER — OXYCODONE-ACETAMINOPHEN 5-325 MG PO TABS: 1.0000 | ORAL_TABLET | Freq: Four times a day (QID) | ORAL | Status: DC | PRN

## 2014-02-16 MED ORDER — HYDROMORPHONE HCL PF 1 MG/ML IJ SOLN
1.0000 mg | Freq: Once | INTRAMUSCULAR | Status: AC
  Administered 2014-02-16: 1 mg via INTRAVENOUS
  Filled 2014-02-16: qty 1

## 2014-02-16 NOTE — ED Provider Notes (Signed)
CSN: [id number]     Arrival date & time 02/16/14  1248 History   First MD Initiated Contact with Patient 02/16/14 1249     Chief Complaint  Patient presents with  . Flank Pain     (Consider location/radiation/quality/duration/timing/severity/associated sxs/prior Treatment) HPI Comments: Patient with a history of Kidney stones presents today with bilateral flank pain.  He reports that the pain has been present for the past week.  Pain is intermittent.  He reports that the pain feels similar to pain that he has had in the past with kidney stones.  Pain is radiating to the groin area.  He reports that he is taking Percocet for the pain, which he feels gives him temporary relief.    He reports that the pain today was associated with nausea and two episodes of emesis.  He denies dysuria, hematuria, fever, chills, abdominal pain, urinary frequency, testicular pain, or scrotal swelling.  Patient is a 48 y.o. male presenting with flank pain. The history is provided by the patient.  Flank Pain Associated symptoms include nausea and vomiting.    Past Medical History  Diagnosis Date  . Kidney stones    History reviewed. No pertinent past surgical history. History reviewed. No pertinent family history. History  Substance Use Topics  . Smoking status: Current Every Day Smoker    Types: Cigars  . Smokeless tobacco: Not on file  . Alcohol Use: No    Review of Systems  Gastrointestinal: Positive for nausea and vomiting.  Genitourinary: Positive for flank pain.  All other systems reviewed and are negative.     Allergies  Review of patient's allergies indicates no known allergies.  Home Medications   Prior to Admission medications   Medication Sig Start Date End Date Taking? Authorizing Provider  naproxen sodium (ANAPROX) 220 MG tablet Take 660 mg by mouth 2 (two) times daily as needed. pain   Yes Historical Provider, MD   BP 122/82  Pulse 103  Temp(Src) 98.6 F (37 C) (Oral)  Resp  20  SpO2 100% Physical Exam  Nursing note and vitals reviewed. Constitutional: He appears well-developed and well-nourished.  HENT:  Head: Normocephalic and atraumatic.  Mouth/Throat: Oropharynx is clear and moist.  Neck: Normal range of motion. Neck supple.  Cardiovascular: Normal rate, regular rhythm and normal heart sounds.   Pulmonary/Chest: Effort normal and breath sounds normal.  Abdominal: Soft. Bowel sounds are normal. He exhibits no distension and no mass. There is no tenderness. There is CVA tenderness. There is no rebound and no guarding. Hernia confirmed negative in the right inguinal area and confirmed negative in the left inguinal area.  Bilateral CVA tenderness  Genitourinary: Right testis shows no mass, no swelling and no tenderness. Left testis shows no mass, no swelling and no tenderness.  Musculoskeletal: Normal range of motion.  Neurological: He is alert.  Skin: Skin is warm and dry.  Psychiatric: He has a normal mood and affect.    ED Course  Procedures (including critical care time) Labs Review Labs Reviewed  CBC WITH DIFFERENTIAL  BASIC METABOLIC PANEL  URINALYSIS, ROUTINE W REFLEX MICROSCOPIC    Imaging Review Ct Abdomen Pelvis Wo Contrast  02/16/2014   CLINICAL DATA:  49 year old male with bilateral flank pain. History of renal calculi.  EXAM: CT ABDOMEN AND PELVIS WITHOUT CONTRAST  TECHNIQUE: Multidetector CT imaging of the abdomen and pelvis was performed following the standard protocol without IV contrast.  COMPARISON:  None.  FINDINGS: There is apparent wall irregularity/thickening of the  distal stomach measuring up to 10 cm in length - suspicious for a gastric mass. There is mild inflammation along the inferior aspect of the stomach.  There is no evidence of bowel obstruction or pneumoperitoneum.  A small amount of free pelvic fluid is noted.  The appendix and remainder of the bowel are unremarkable.  The liver, spleen, gallbladder, adrenal glands, kidneys  and pancreas are unremarkable except for mild prominence of the pancreatic duct. Please note that parenchymal abnormalities may be missed without intravenous contrast.  There is no evidence of biliary dilatation, abdominal aortic aneurysm or definite enlarged lymph nodes.  No acute suspicious bony abnormalities are identified.  IMPRESSION: Probable distal stomach irregular wall thickening worrisome for neoplasm/lymphoma. Direct inspection/ further evaluation is recommended. Mild adjacent inflammation.  No evidence of urinary calculi.   Electronically Signed   By: Laveda Abbe M.D.   On: 02/16/2014 15:08     EKG Interpretation None     3:51 PM Patient reports that his pain has resolved at this time.  He reports that nausea has also improved.   MDM   Final diagnoses:  None   Patient with a history of kidney stones presenting with bilateral flank pain that has been present intermittently for a week.  UA unremarkable.  Labs today unremarkable.  Patient is anemic, but hemoglobin significantly improved from previously.  CT scan results as above showing no evidence of urinary calculi.  CT did show probable distal stomach irregular thickening worrisome for neoplasm/lymphoma.  Discussed CT results at length with patient.  Stressed the importance of follow up with GI with the patient.  Patient demonstrated understanding.  Feel that the patient is stable for discharge.  Return precautions given.    Santiago Glad, PA-C 02/16/14 [phone]

## 2014-02-16 NOTE — ED Notes (Signed)
Pt to ct 

## 2014-02-16 NOTE — ED Notes (Signed)
Returned from ct 

## 2014-02-16 NOTE — ED Provider Notes (Signed)
  Medical screening examination/treatment/procedure(s) were performed by non-physician practitioner and as supervising physician I was immediately available for consultation/collaboration.  Gerhard Munch, MD 02/16/14 [phone]

## 2014-02-16 NOTE — ED Notes (Signed)
Pt here from home with c/o bil flank pain , pt was seen and given percocet but has run out of those percocet and is in pain

## 2014-02-16 NOTE — Discharge Instructions (Signed)
Your CT scan today showed thickening of the stomach.  This could possibly be Cancer.  It is very important for you to follow up with a Gastroenterologist above to schedule a follow up appointment.  This is very important.  CT scan today did not show kidney stones.     Anemia, Nonspecific Anemia is a condition in which the concentration of red blood cells or hemoglobin in the blood is below normal. Hemoglobin is a substance in red blood cells that carries oxygen to the tissues of the body. Anemia results in not enough oxygen reaching these tissues.  CAUSES  Common causes of anemia include:   Excessive bleeding. Bleeding may be internal or external. This includes excessive bleeding from periods (in women) or from the intestine.   Poor nutrition.   Chronic kidney, thyroid, and liver disease.  Bone marrow disorders that decrease red blood cell production.  Cancer and treatments for cancer.  HIV, AIDS, and their treatments.  Spleen problems that increase red blood cell destruction.  Blood disorders.  Excess destruction of red blood cells due to infection, medicines, and autoimmune disorders. SIGNS AND SYMPTOMS   Minor weakness.   Dizziness.   Headache.  Palpitations.   Shortness of breath, especially with exercise.   Paleness.  Cold sensitivity.  Indigestion.  Nausea.  Difficulty sleeping.  Difficulty concentrating. Symptoms may occur suddenly or they may develop slowly.  DIAGNOSIS  Additional blood tests are often needed. These help your health care provider determine the best treatment. Your health care provider will check your stool for blood and look for other causes of blood loss.  TREATMENT  Treatment varies depending on the cause of the anemia. Treatment can include:   Supplements of iron, vitamin B12, or folic acid.   Hormone medicines.   A blood transfusion. This may be needed if blood loss is severe.   Hospitalization. This may be needed if  there is significant continual blood loss.   Dietary changes.  Spleen removal. HOME CARE INSTRUCTIONS Keep all follow-up appointments. It often takes many weeks to correct anemia, and having your health care provider check on your condition and your response to treatment is very important. SEEK IMMEDIATE MEDICAL CARE IF:   You develop extreme weakness, shortness of breath, or chest pain.   You become dizzy or have trouble concentrating.  You develop heavy vaginal bleeding.   You develop a rash.   You have bloody or black, tarry stools.   You faint.   You vomit up blood.   You vomit repeatedly.   You have abdominal pain.  You have a fever or persistent symptoms for more than 2-3 days.   You have a fever and your symptoms suddenly get worse.   You are dehydrated.  MAKE SURE YOU:  Understand these instructions.  Will watch your condition.  Will get help right away if you are not doing well or get worse. Document Released: 07/16/2004 Document Revised: 02/08/2013 Document Reviewed: 12/02/2012 [facility] Patient Information 2015 [place], [place]. This information is not intended to replace advice given to you by your health care provider. Make sure you discuss any questions you have with your health care provider.  Abdominal Pain Many things can cause belly (abdominal) pain. Most times, the belly pain is not dangerous. Many cases of belly pain can be watched and treated at home. HOME CARE   Do not take medicines that help you go poop (laxatives) unless told to by your doctor.  Only take medicine as told  by your doctor.  Eat or drink as told by your doctor. Your doctor will tell you if you should be on a special diet. GET HELP IF:  You do not know what is causing your belly pain.  You have belly pain while you are sick to your stomach (nauseous) or have runny poop (diarrhea).  You have pain while you pee or poop.  Your belly pain wakes you up at  night.  You have belly pain that gets worse or better when you eat.  You have belly pain that gets worse when you eat fatty foods.  You have a fever. GET HELP RIGHT AWAY IF:   The pain does not go away within 2 hours.  You keep throwing up (vomiting).  The pain changes and is only in the right or left part of the belly.  You have bloody or tarry looking poop. MAKE SURE YOU:   Understand these instructions.  Will watch your condition.  Will get help right away if you are not doing well or get worse. Document Released: 11/25/2007 Document Revised: 06/13/2013 Document Reviewed: 02/15/2013 [facility] Patient Information 2015 [place], [place]. This information is not intended to replace advice given to you by your health care provider. Make sure you discuss any questions you have with your health care provider.

## 2014-02-19 NOTE — Discharge Planning (Signed)
[facility] Liaison was not able to see pt, [facility] orange card information will be sent to the address listed. °

## 2014-02-21 ENCOUNTER — Emergency Department: Payer: Self-pay | Admitting: Emergency Medicine

## 2014-02-21 LAB — COMPREHENSIVE METABOLIC PANEL
Albumin: 3.2 g/dL — ABNORMAL LOW (ref 3.4–5.0)
Alkaline Phosphatase: 102 U/L
Anion Gap: 9 (ref 7–16)
BUN: 9 mg/dL (ref 7–18)
Bilirubin,Total: 0.1 mg/dL — ABNORMAL LOW (ref 0.2–1.0)
Calcium, Total: 9.7 mg/dL (ref 8.5–10.1)
Chloride: 106 mmol/L (ref 98–107)
Co2: 21 mmol/L (ref 21–32)
Creatinine: 0.89 mg/dL (ref 0.60–1.30)
EGFR (African American): >60
EGFR (Non-African Amer.): >60
Glucose: 96 mg/dL (ref 65–99)
Osmolality: 271 (ref 275–301)
Potassium: 3.5 mmol/L (ref 3.5–5.1)
SGOT(AST): 14 U/L — ABNORMAL LOW (ref 15–37)
SGPT (ALT): 16 U/L
Sodium: 136 mmol/L (ref 136–145)
Total Protein: 8.7 g/dL — ABNORMAL HIGH (ref 6.4–8.2)

## 2014-02-21 LAB — URINALYSIS, COMPLETE
Bacteria: NONE SEEN
Bilirubin,UR: NEGATIVE
Blood: NEGATIVE
Glucose,UR: NEGATIVE mg/dL (ref 0–75)
Hyaline Cast: 3
Ketone: NEGATIVE
Leukocyte Esterase: NEGATIVE
Nitrite: NEGATIVE
Ph: 5 (ref 4.5–8.0)
Protein: 30
RBC,UR: 16 /HPF (ref 0–5)
Specific Gravity: 1.028 (ref 1.003–1.030)
Squamous Epithelial: NONE SEEN
WBC UR: 8 /HPF (ref 0–5)

## 2014-02-21 LAB — CBC
HCT: 34.8 % — ABNORMAL LOW (ref 40.0–52.0)
HGB: 10.8 g/dL — ABNORMAL LOW (ref 13.0–18.0)
MCH: 28.7 pg (ref 26.0–34.0)
MCHC: 31 g/dL — ABNORMAL LOW (ref 32.0–36.0)
MCV: 93 fL (ref 80–100)
Platelet: 649 10*3/uL — ABNORMAL HIGH (ref 150–440)
RBC: 3.76 10*6/uL — ABNORMAL LOW (ref 4.40–5.90)
RDW: 19.9 % — ABNORMAL HIGH (ref 11.5–14.5)
WBC: 10.8 10*3/uL — ABNORMAL HIGH (ref 3.8–10.6)

## 2014-03-17 NOTE — ED Provider Notes (Deleted)
CSN: [id number]     Arrival date & time 02/05/14  0909 History   First MD Initiated Contact with Patient 02/05/14 0912     Chief Complaint  Patient presents with  . Wound Infection    HPI Pt presents with worsening swelling below his chin and face for the past several days. He states pus has been draining. He reports moderate pain. No difficulty breathing or swallowing. No fever. +generalized weakness. No SOB or CP. No anterior neck pain or swelling. Poor dentition. Does not have a dentist. Nothing worsens or improves his pain    Past Medical History  Diagnosis Date  . Kidney stones    History reviewed. No pertinent past surgical history. No family history on file. History  Substance Use Topics  . Smoking status: Current Every Day Smoker    Types: Cigars  . Smokeless tobacco: Not on file  . Alcohol Use: No    Review of Systems  All other systems reviewed and are negative.     Allergies  Review of patient's allergies indicates no known allergies.  Home Medications   Prior to Admission medications   Medication Sig Start Date End Date Taking? Authorizing Provider  ibuprofen (ADVIL,MOTRIN) 200 MG tablet Take 200 mg by mouth every 6 (six) hours as needed for fever, headache or mild pain.    Historical Provider, MD  naproxen sodium (ANAPROX) 220 MG tablet Take 220 mg by mouth 2 (two) times daily as needed.    Historical Provider, MD  naproxen sodium (ANAPROX) 220 MG tablet Take 660 mg by mouth 2 (two) times daily as needed. pain    Historical Provider, MD  OVER THE COUNTER MEDICATION Take 5 mLs by mouth every 6 (six) hours as needed (for cough). [employer] brand of cough syrup    Historical Provider, MD  oxyCODONE-acetaminophen (PERCOCET/ROXICET) 5-325 MG per tablet Take 1-2 tablets by mouth every 6 (six) hours as needed for severe pain. 02/16/14   Heather Laisure, PA-C  promethazine (PHENERGAN) 25 MG tablet Take 1 tablet (25 mg total) by mouth every 6 (six) hours as needed  for nausea. 02/16/14   Heather Laisure, PA-C   BP 127/74  Pulse 90  Resp 14  SpO2 100% Physical Exam  Nursing note and vitals reviewed. Constitutional: He is oriented to person, place, and time. He appears well-developed and well-nourished.  HENT:  Head: Normocephalic and atraumatic.  Poor dentition throughout. No gingival swelling or abscess noted. Submandibular abscess present with some drainage in the midline. No anterior neck swelling or cellulitis present. Airway intact. No stridor. Posterior pharynx is normal. Tolerating secetions  Eyes: EOM are normal.  Neck: Normal range of motion.  Cardiovascular: Normal rate, regular rhythm, normal heart sounds and intact distal pulses.   Pulmonary/Chest: Effort normal and breath sounds normal. No respiratory distress.  Abdominal: Soft. He exhibits no distension. There is no tenderness.  Musculoskeletal: Normal range of motion.  Neurological: He is alert and oriented to person, place, and time.  Skin: Skin is warm and dry.  Psychiatric: He has a normal mood and affect. Judgment normal.    ED Course  Procedures (including critical care time)  INCISION AND DRAINAGE Performed by: Lyanne Co Consent: Verbal consent obtained. Risks and benefits: risks, benefits and alternatives were discussed Time out performed prior to procedure Type: abscess Body area: submandibular region in midline Anesthesia: local infiltration Incision was made with a scalpel. Local anesthetic: lidocaine 2% with epinephrine Anesthetic total: 7 ml Complexity: complex Blunt dissection to  break up loculations Drainage: purulent Drainage amount: moderate Packing material: none Patient tolerance: Patient tolerated the procedure well with no immediate complications.     Labs Review Labs Reviewed  BASIC METABOLIC PANEL - Abnormal; Notable for the following:    Glucose, Bld 100 (*)    All other components within normal limits  CBC - Abnormal; Notable for the  following:    WBC 12.2 (*)    RBC 2.18 (*)    Hemoglobin 6.7 (*)    HCT 21.6 (*)    RDW 19.3 (*)    Platelets 700 (*)    All other components within normal limits    Imaging Review CLINICAL DATA: Swelling. Draining lesion below the chin.  EXAM:  CT NECK WITH CONTRAST  TECHNIQUE:  Multidetector CT imaging of the neck was performed using the  standard protocol following the bolus administration of intravenous  contrast.  CONTRAST: 80mL OMNIPAQUE IOHEXOL 300 MG/ML SOLN  COMPARISON: None.  FINDINGS:  Visualized orbits are unremarkable. Base of the brain is  unremarkable. Visualized paranasal sinuses and mastoids are clear.  Tongue and tongue base are normal. Nasopharynx, oropharynx, larynx,  and trachea unremarkable. Parotid and submandibular glands are  normal. Multiple prominent submandibular and submental lymph nodes  are present. Lymph nodes measure up to 1 cm. Diffuse edema noted  about the mandible with what appears to be an ulceration in the sub  mandibular soft tissues just the left of midline. Multiple lucencies  with cortical destruction noted about the mandible particularly  prominent in the left anterior lower ramus. Linear lucencies are  also present. These findings are consistent with severe dental  caries and probable multifocal osteomyelitis involving the mandible.  Thyroid is unremarkable. Changes of COPD noted. Visualized great  vessels are normal.  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 These changes are most  likely related to dental caries. There is adjacent severe soft  tissue swelling/ cellulitis and probable soft tissue ulceration.        MDM   Final diagnoses:  Abscess    Iv unasyn given in ER. I and D at the bedside. Dc home on clindamycin. Oral surgery follow up. Nontoxic. Vitals normal     Lyanne Co, MD 03/17/14 2153

## 2014-10-19 IMAGING — CT CT ABD-PELV W/O CM
2 of 4 series · 13 of 46 positions shown, 15 images · non-contrast
Comparison: None.

CLINICAL DATA: 48-year-old male with bilateral flank pain. History
of renal calculi.

EXAM:
CT ABDOMEN AND PELVIS WITHOUT CONTRAST
TECHNIQUE: Multidetector CT imaging of the abdomen and pelvis was performed
following the standard protocol without IV contrast.

[Series 201: stone study, idose (2) · axial · 0.73mm/px · z∈[-41,+324]mm · 10 of 89 slices shown, 12 images]
[im 8/89  soft-tissue]
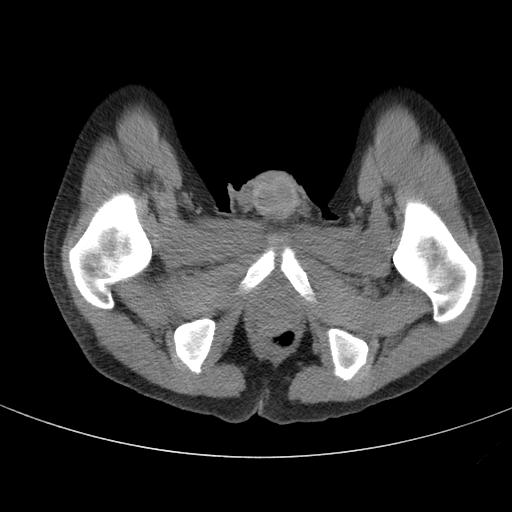
[im 8/89  bone]
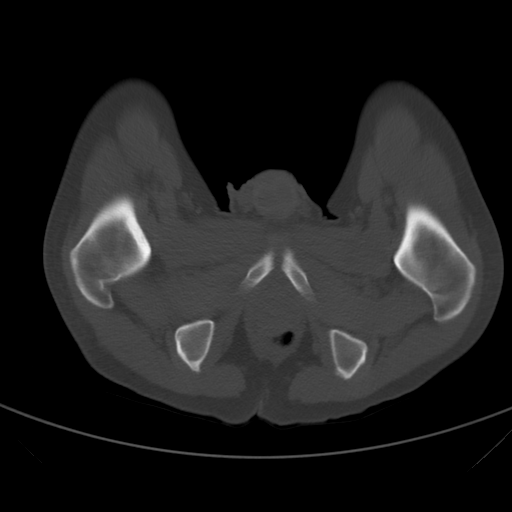
[im 16/89  soft-tissue]
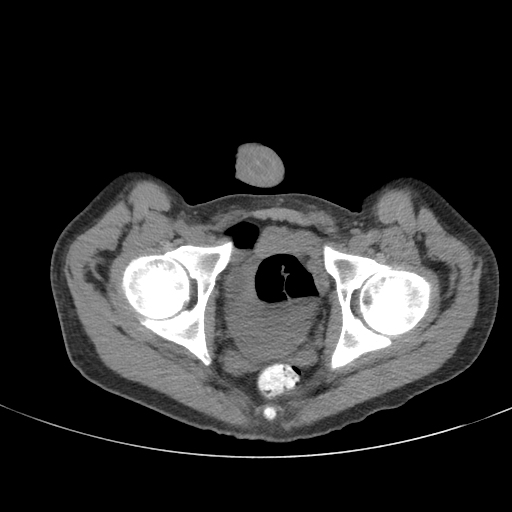
[im 23/89  soft-tissue]
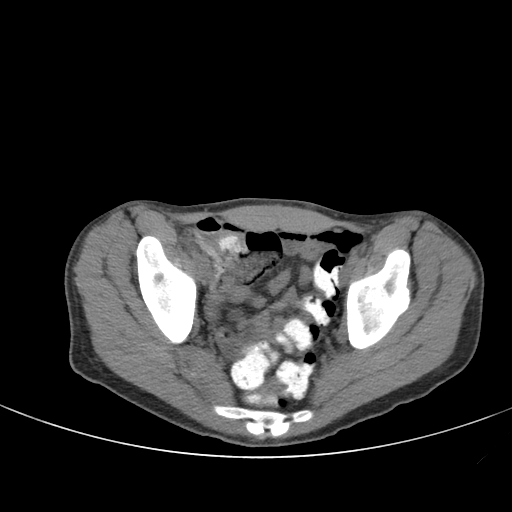
[im 31/89  soft-tissue]
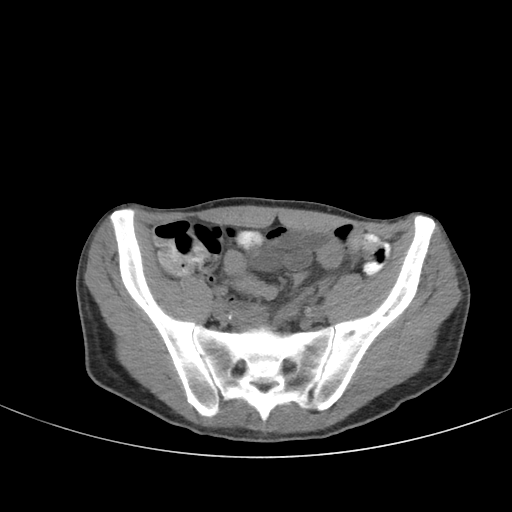
[im 39/89  soft-tissue]
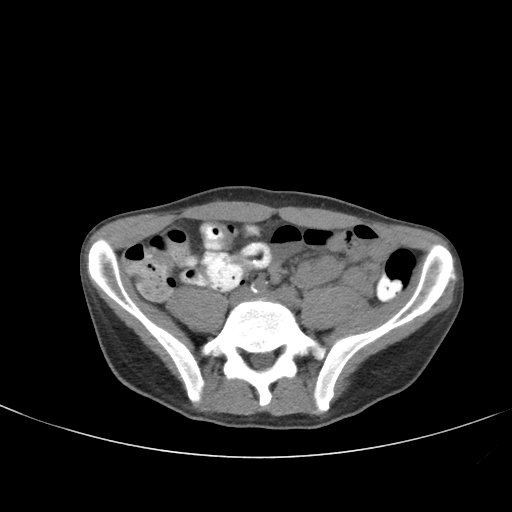
[im 50/89  soft-tissue]
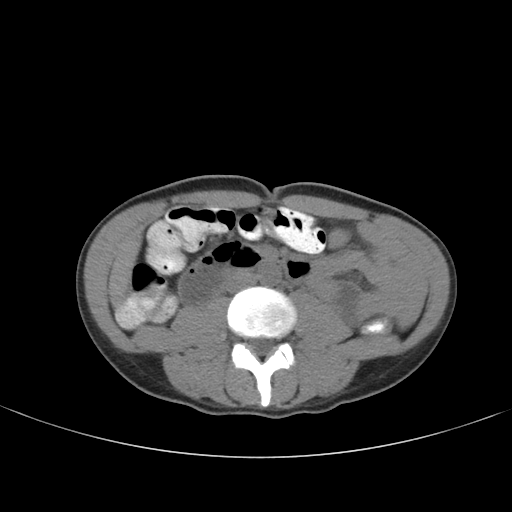
[im 58/89  soft-tissue]
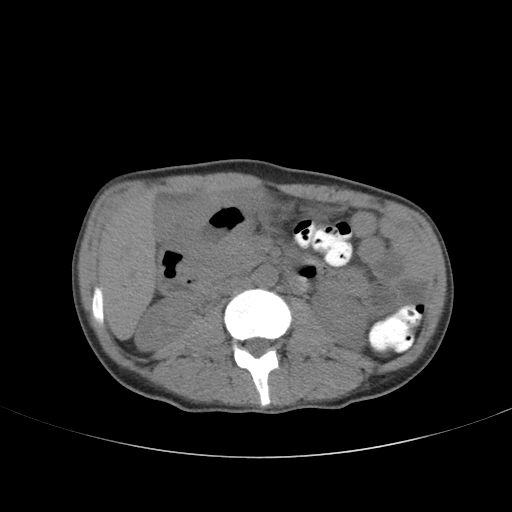
[im 66/89  soft-tissue]
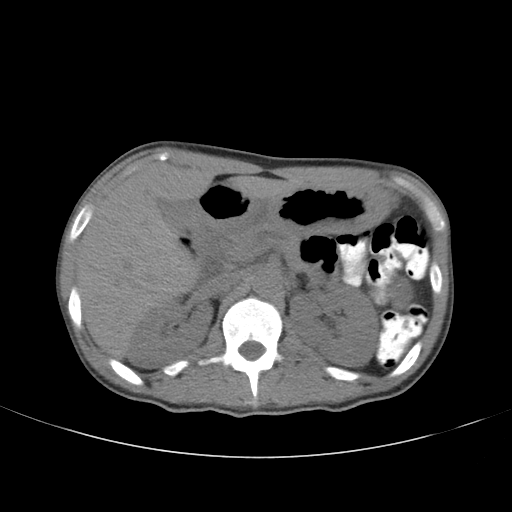
[im 73/89  soft-tissue]
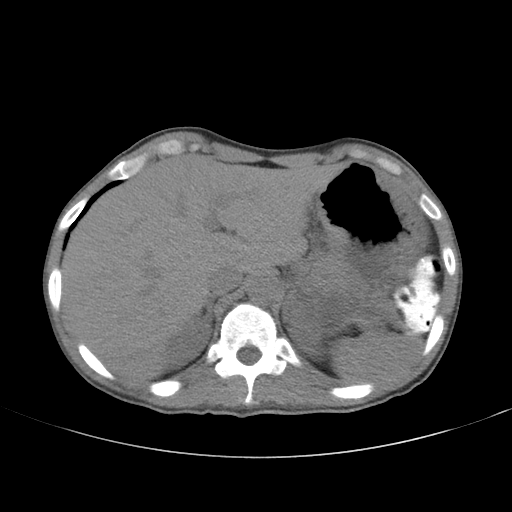
[im 73/89  bone]
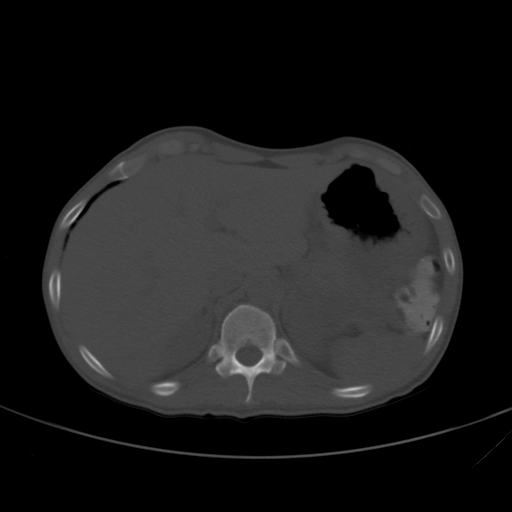
[im 81/89  soft-tissue]
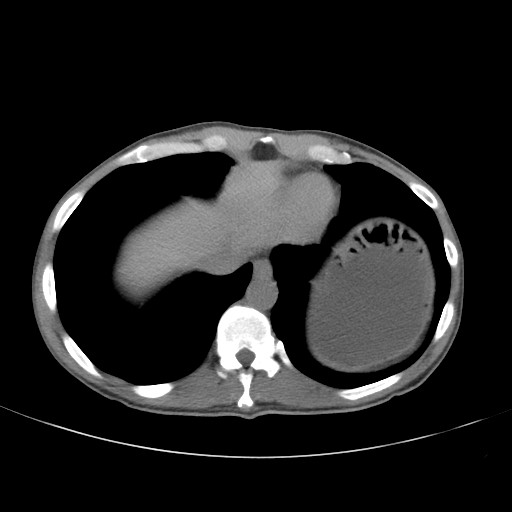

[Series 202: coronals, idose (3) · coronal · 0.50mm/px · 3 of 97 slices shown]
[im 33/97  soft-tissue]
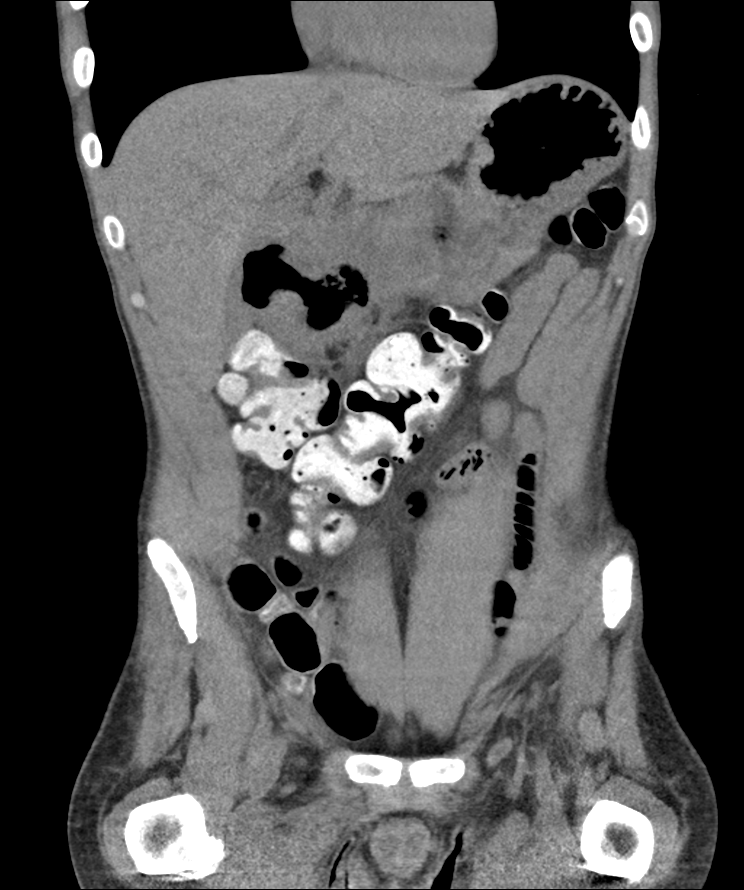
[im 43/97  soft-tissue]
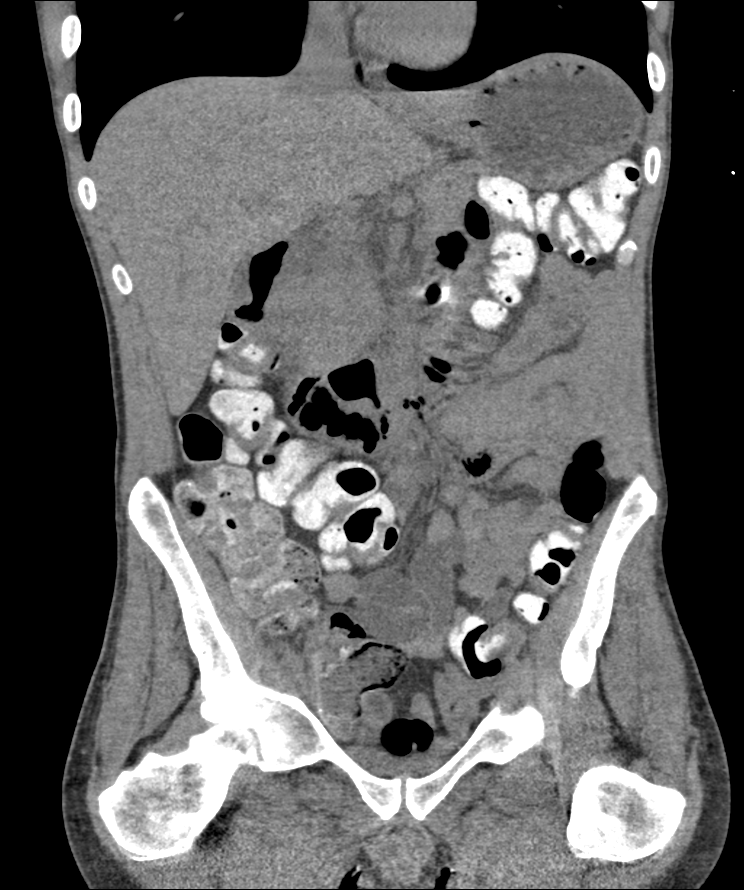
[im 54/97  soft-tissue]
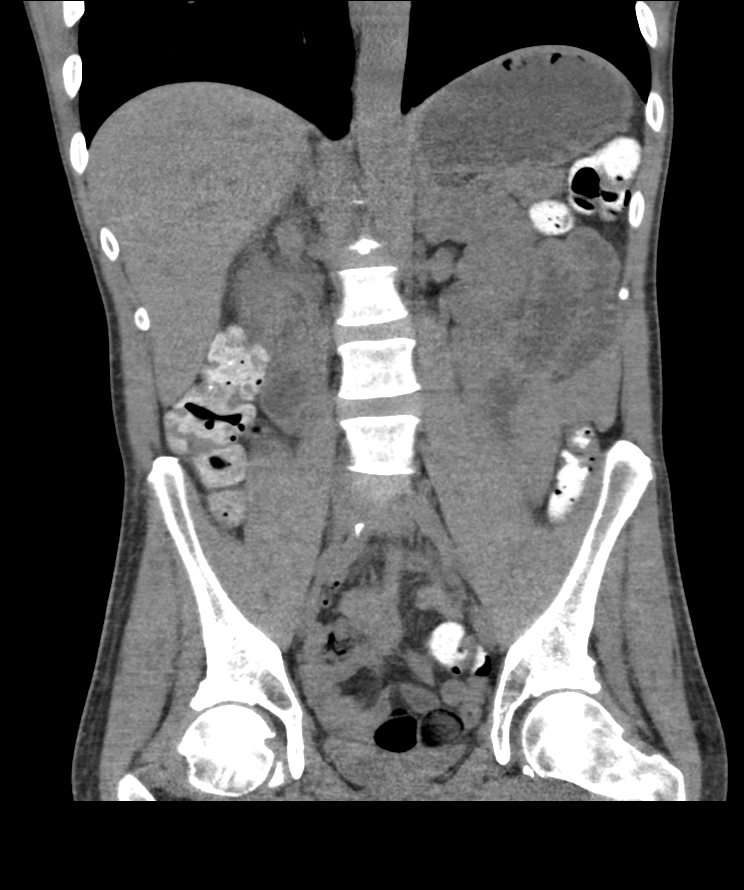

[13 of 46 positions shown; findings below may reference images not displayed]

FINDINGS: There is apparent wall irregularity/thickening of the distal stomach
measuring up to 10 cm in length - suspicious for a gastric mass.
There is mild inflammation along the inferior aspect of the stomach.

There is no evidence of bowel obstruction or pneumoperitoneum.

A small amount of free pelvic fluid is noted.

The appendix and remainder of the bowel are unremarkable.

The liver, spleen, gallbladder, adrenal glands, kidneys and pancreas
are unremarkable except for mild prominence of the pancreatic duct.
Please note that parenchymal abnormalities may be missed without
intravenous contrast.

There is no evidence of biliary dilatation, abdominal aortic
aneurysm or definite enlarged lymph nodes.

No acute suspicious bony abnormalities are identified.
IMPRESSION: Probable distal stomach irregular wall thickening worrisome for
neoplasm/lymphoma. Direct inspection/ further evaluation is
recommended. Mild adjacent inflammation.

No evidence of urinary calculi.

## 2015-05-09 ENCOUNTER — Encounter (HOSPITAL_COMMUNITY): Payer: Self-pay | Admitting: Emergency Medicine

## 2015-05-09 ENCOUNTER — Emergency Department (HOSPITAL_COMMUNITY)
Admission: EM | Admit: 2015-05-09 | Discharge: 2015-05-09 | Disposition: A | Discharge status: Home / Self Care | Payer: No Typology Code available for payment source | Attending: Emergency Medicine | Admitting: Emergency Medicine

## 2015-05-09 DIAGNOSIS — Z79899 Other long term (current) drug therapy: Secondary | ICD-10-CM | POA: Insufficient documentation

## 2015-05-09 DIAGNOSIS — Y92410 Unspecified street and highway as the place of occurrence of the external cause: Secondary | ICD-10-CM | POA: Insufficient documentation

## 2015-05-09 DIAGNOSIS — Y998 Other external cause status: Secondary | ICD-10-CM | POA: Insufficient documentation

## 2015-05-09 DIAGNOSIS — S199XXA Unspecified injury of neck, initial encounter: Secondary | ICD-10-CM | POA: Insufficient documentation

## 2015-05-09 DIAGNOSIS — Y9389 Activity, other specified: Secondary | ICD-10-CM | POA: Diagnosis not present

## 2015-05-09 DIAGNOSIS — S299XXA Unspecified injury of thorax, initial encounter: Secondary | ICD-10-CM | POA: Diagnosis not present

## 2015-05-09 DIAGNOSIS — F17210 Nicotine dependence, cigarettes, uncomplicated: Secondary | ICD-10-CM | POA: Diagnosis not present

## 2015-05-09 DIAGNOSIS — Z87442 Personal history of urinary calculi: Secondary | ICD-10-CM | POA: Insufficient documentation

## 2015-05-09 MED ORDER — NAPROXEN SODIUM 220 MG PO TABS: 220.0000 mg | ORAL_TABLET | Freq: Three times a day (TID) | ORAL | Status: AC

## 2015-05-09 MED ORDER — METHOCARBAMOL 500 MG PO TABS: 500.0000 mg | ORAL_TABLET | Freq: Two times a day (BID) | ORAL | Status: AC

## 2015-05-09 MED ORDER — KETOROLAC TROMETHAMINE 60 MG/2ML IM SOLN
60.0000 mg | Freq: Once | INTRAMUSCULAR | Status: AC
  Administered 2015-05-09: 60 mg via INTRAMUSCULAR
  Filled 2015-05-09: qty 2
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
